# Patient Record
Sex: Female | Born: 1950 | Race: Black or African American | Hispanic: No | Marital: Single
Health system: Southern US, Community
[De-identification: ages and names within clinical notes are randomized; demographics above are authoritative.]

## PROBLEM LIST (undated history)

## (undated) DIAGNOSIS — I10 Essential (primary) hypertension: Secondary | ICD-10-CM

## (undated) DIAGNOSIS — E119 Type 2 diabetes mellitus without complications: Secondary | ICD-10-CM

## (undated) DIAGNOSIS — G3184 Mild cognitive impairment, so stated: Secondary | ICD-10-CM

## (undated) DIAGNOSIS — J449 Chronic obstructive pulmonary disease, unspecified: Secondary | ICD-10-CM

---

## 2017-08-12 ENCOUNTER — Other Ambulatory Visit (HOSPITAL_COMMUNITY): Payer: Self-pay | Admitting: Family Medicine

## 2017-08-12 DIAGNOSIS — H538 Other visual disturbances: Secondary | ICD-10-CM

## 2017-08-12 DIAGNOSIS — R51 Headache: Secondary | ICD-10-CM

## 2017-08-12 DIAGNOSIS — H53149 Visual discomfort, unspecified: Secondary | ICD-10-CM

## 2017-08-12 DIAGNOSIS — R519 Headache, unspecified: Secondary | ICD-10-CM

## 2017-08-19 ENCOUNTER — Ambulatory Visit (HOSPITAL_COMMUNITY)
Admission: RE | Admit: 2017-08-19 | Discharge: 2017-08-19 | Disposition: A | Discharge status: Home / Self Care | Payer: Medicare Other | Source: Ambulatory Visit | Attending: Family Medicine | Admitting: Family Medicine

## 2017-08-19 DIAGNOSIS — H53149 Visual discomfort, unspecified: Secondary | ICD-10-CM

## 2017-08-19 DIAGNOSIS — I672 Cerebral atherosclerosis: Secondary | ICD-10-CM | POA: Diagnosis not present

## 2017-08-19 DIAGNOSIS — R51 Headache: Secondary | ICD-10-CM | POA: Insufficient documentation

## 2017-08-19 DIAGNOSIS — I6782 Cerebral ischemia: Secondary | ICD-10-CM | POA: Diagnosis not present

## 2017-08-19 DIAGNOSIS — R519 Headache, unspecified: Secondary | ICD-10-CM

## 2017-08-19 DIAGNOSIS — H538 Other visual disturbances: Secondary | ICD-10-CM

## 2017-08-19 MED ORDER — IOPAMIDOL (ISOVUE-300) INJECTION 61%
INTRAVENOUS | Status: AC
  Filled 2017-08-19: qty 75

## 2017-08-19 MED ORDER — IOPAMIDOL (ISOVUE-300) INJECTION 61%
75.0000 mL | Freq: Once | INTRAVENOUS | Status: AC | PRN
  Administered 2017-08-19: 75 mL via INTRAVENOUS

## 2019-05-01 IMAGING — CT CT HEAD WO/W CM
3 of 4 series · 15 of 47 positions shown, 18 images · IV contrast (iopamidol)
Comparison: None.

CLINICAL DATA: Persistent headache, photophobia and blurry vision
for 3 months.

EXAM:
CT HEAD WITHOUT AND WITH CONTRAST
TECHNIQUE: Contiguous axial images were obtained from the base of the skull
through the vertex without and with intravenous contrast
CONTRAST:  75mL 8TYJS4-FII IOPAMIDOL (8TYJS4-FII) INJECTION 61%

[Series 2: head wo · axial · 0.47mm/px · z∈[+1289,+1409]mm · 9 of 30 slices shown, 12 images]
[im 3/30  brain]
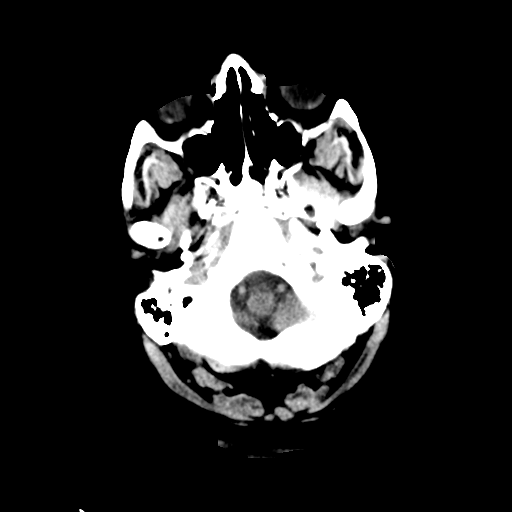
[im 3/30  bone]
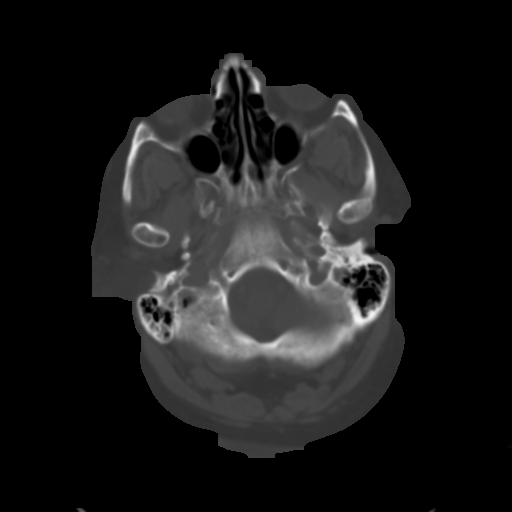
[im 7/30  brain]
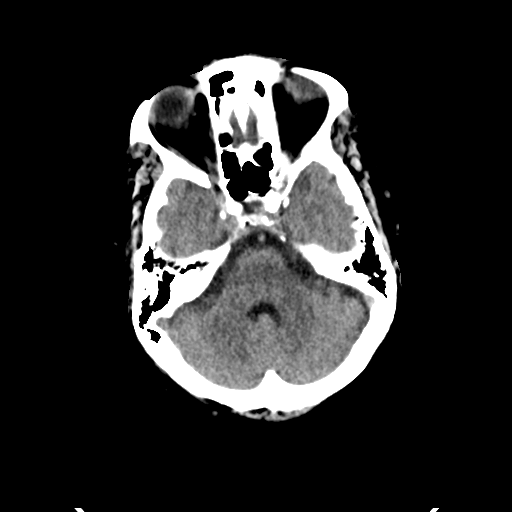
[im 9/30  brain]
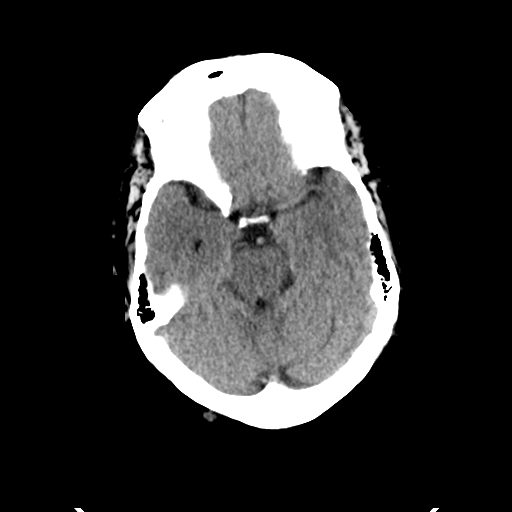
[im 13/30  brain]
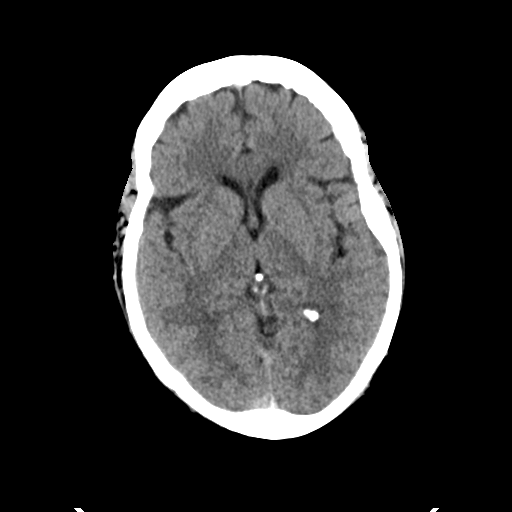
[im 15/30  brain]
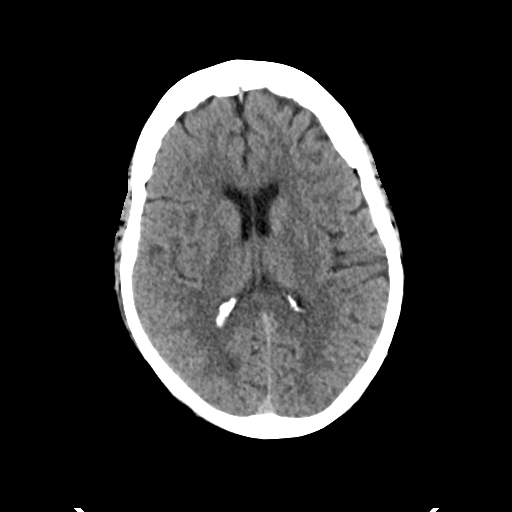
[im 15/30  bone]
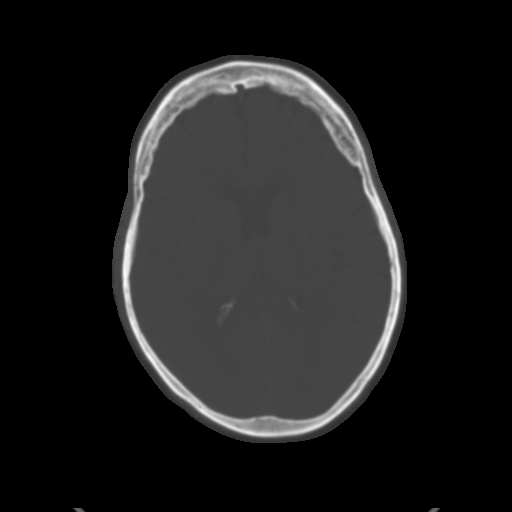
[im 17/30  brain]
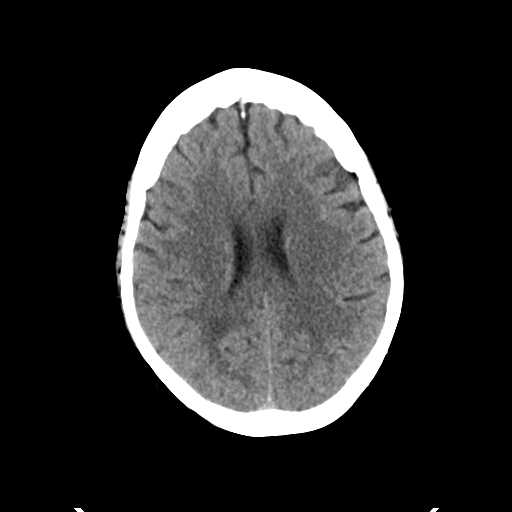
[im 21/30  brain]
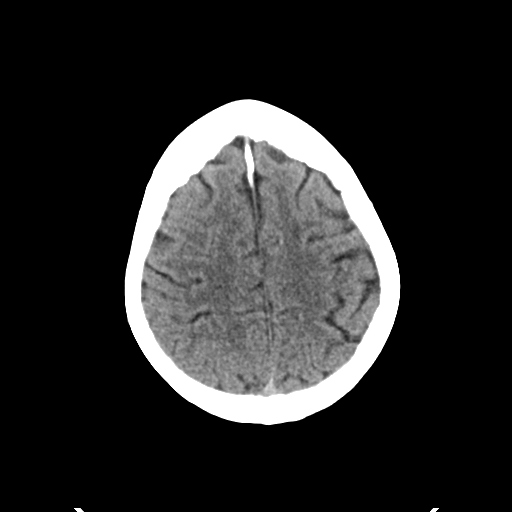
[im 23/30  brain]
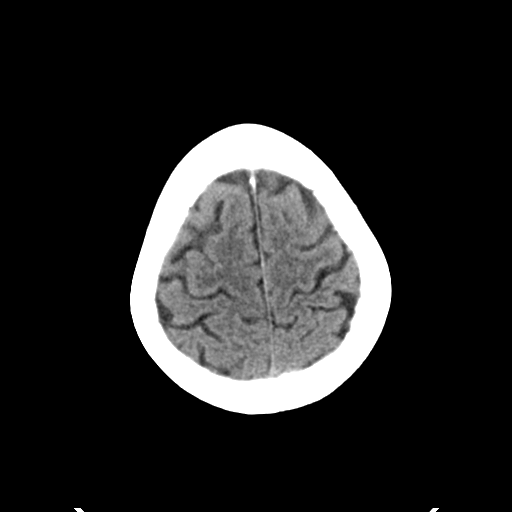
[im 27/30  brain]
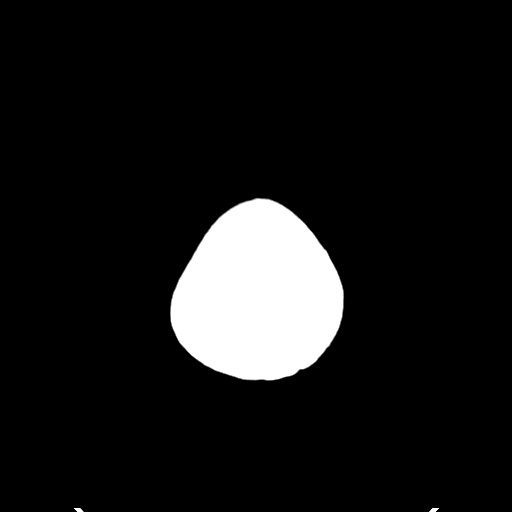
[im 27/30  bone]
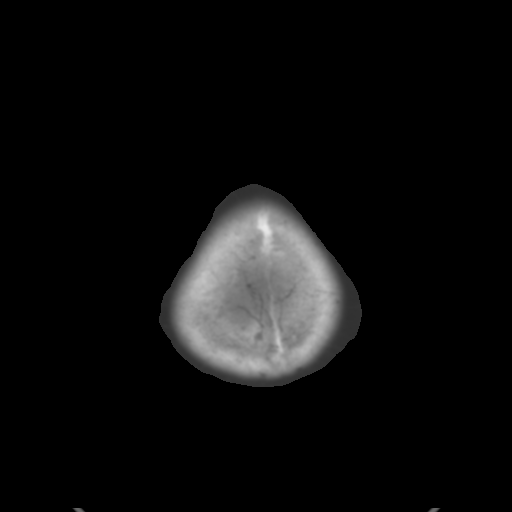

[Series 5: coronal soft tissue · coronal · 0.31mm/px · 3 of 64 slices shown]
[im 22/64  brain]
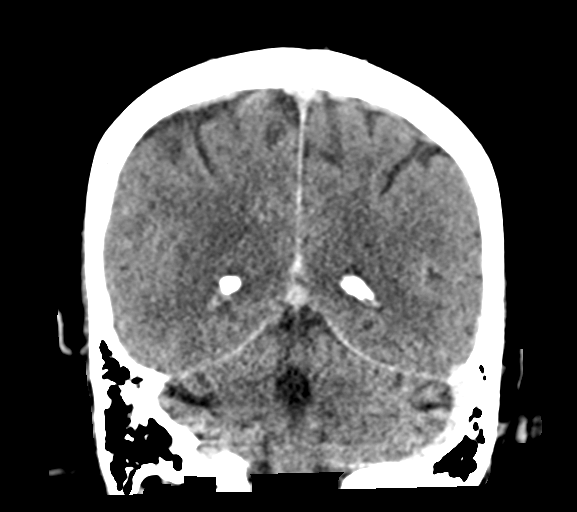
[im 29/64  brain]
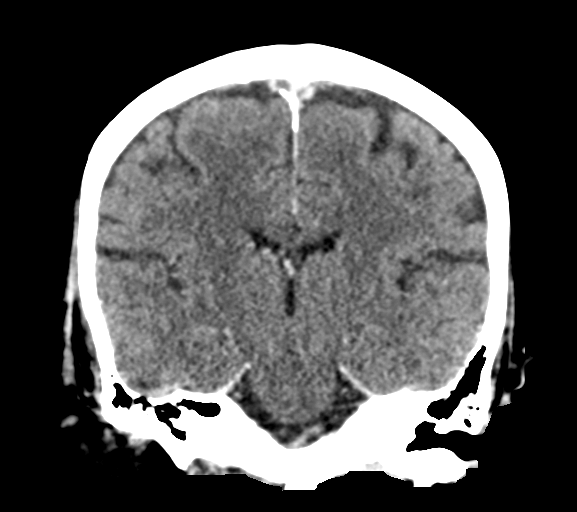
[im 36/64  brain]
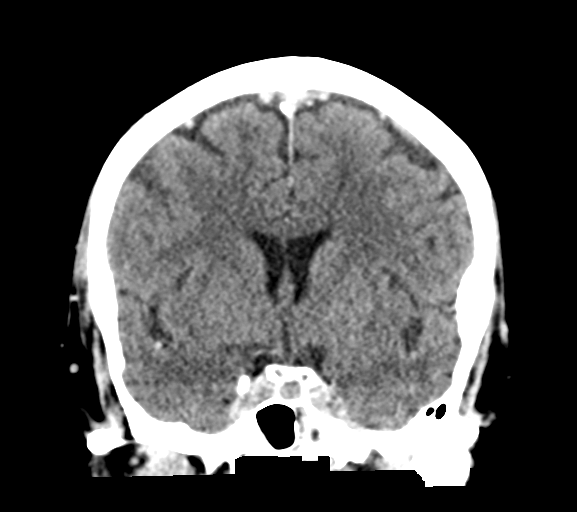

[Series 6: sagittal soft tissue · sagittal · 0.31mm/px · 3 of 60 slices shown]
[im 20/60  brain]
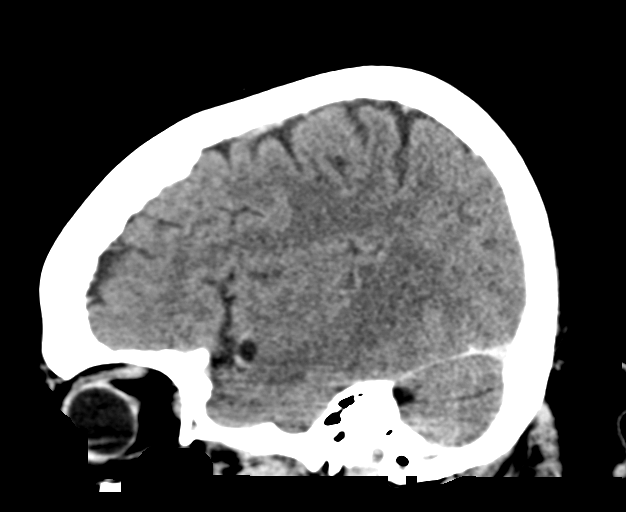
[im 30/60  brain]
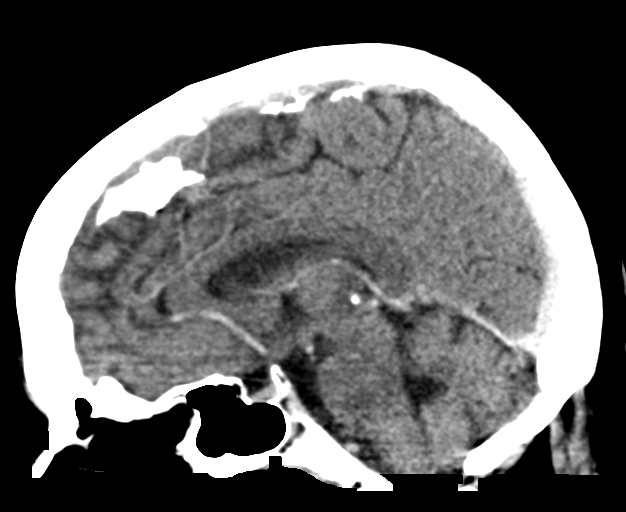
[im 40/60  brain]
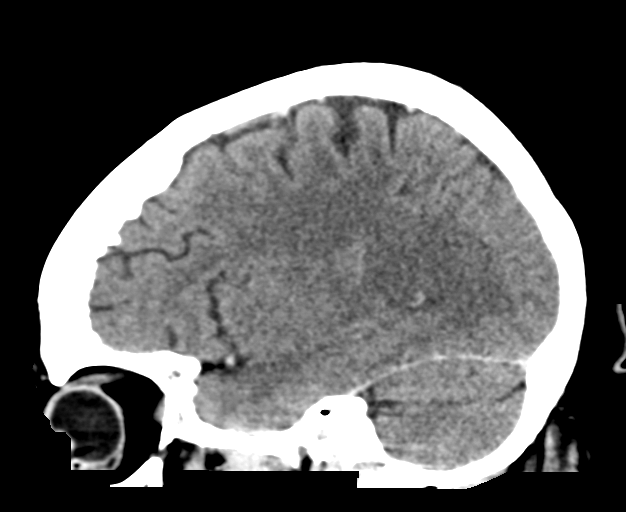

[15 of 47 positions shown; findings below may reference images not displayed]

FINDINGS: BRAIN: No intraparenchymal hemorrhage, mass effect nor midline
shift. The ventricles and sulci are normal for age. Patchy
supratentorial white matter hypodensities. No acute large vascular
territory infarcts. No abnormal extra-axial fluid collections. Basal
cisterns are patent. No abnormal intracranial enhancement, dural
venous sinuses are patent. 4 mm pineal cyst.

VASCULAR: Moderate calcific atherosclerosis of the carotid siphons.

SKULL: No skull fracture. No significant scalp soft tissue swelling.

SINUSES/ORBITS: The mastoid air-cells and included paranasal sinuses
are well-aerated.The included ocular globes and orbital contents are
non-suspicious.

OTHER: None.
IMPRESSION: 1. No acute intracranial process.
2. Mild chronic small vessel ischemic disease and moderate
atherosclerosis.

## 2022-09-17 ENCOUNTER — Encounter (HOSPITAL_COMMUNITY): Payer: Self-pay

## 2022-09-17 ENCOUNTER — Emergency Department (HOSPITAL_COMMUNITY): Payer: Medicare Other

## 2022-09-17 ENCOUNTER — Other Ambulatory Visit: Payer: Self-pay

## 2022-09-17 ENCOUNTER — Inpatient Hospital Stay (HOSPITAL_COMMUNITY)
Admission: EM | Admit: 2022-09-17 | Discharge: 2022-09-20 | DRG: 871 | Disposition: A | Discharge status: Skilled Nursing Facility | Payer: Medicare Other | Source: Skilled Nursing Facility | Attending: Internal Medicine | Admitting: Internal Medicine

## 2022-09-17 DIAGNOSIS — E785 Hyperlipidemia, unspecified: Secondary | ICD-10-CM | POA: Diagnosis present

## 2022-09-17 DIAGNOSIS — J441 Chronic obstructive pulmonary disease with (acute) exacerbation: Secondary | ICD-10-CM | POA: Diagnosis present

## 2022-09-17 DIAGNOSIS — J9601 Acute respiratory failure with hypoxia: Secondary | ICD-10-CM | POA: Diagnosis present

## 2022-09-17 DIAGNOSIS — M6240 Contracture of muscle, unspecified site: Secondary | ICD-10-CM | POA: Diagnosis present

## 2022-09-17 DIAGNOSIS — F329 Major depressive disorder, single episode, unspecified: Secondary | ICD-10-CM | POA: Diagnosis present

## 2022-09-17 DIAGNOSIS — H6121 Impacted cerumen, right ear: Secondary | ICD-10-CM | POA: Diagnosis present

## 2022-09-17 DIAGNOSIS — M069 Rheumatoid arthritis, unspecified: Secondary | ICD-10-CM | POA: Diagnosis present

## 2022-09-17 DIAGNOSIS — R4182 Altered mental status, unspecified: Secondary | ICD-10-CM | POA: Diagnosis not present

## 2022-09-17 DIAGNOSIS — I10 Essential (primary) hypertension: Secondary | ICD-10-CM | POA: Diagnosis present

## 2022-09-17 DIAGNOSIS — E041 Nontoxic single thyroid nodule: Secondary | ICD-10-CM | POA: Diagnosis present

## 2022-09-17 DIAGNOSIS — G9341 Metabolic encephalopathy: Secondary | ICD-10-CM | POA: Diagnosis present

## 2022-09-17 DIAGNOSIS — R6520 Severe sepsis without septic shock: Secondary | ICD-10-CM | POA: Diagnosis present

## 2022-09-17 DIAGNOSIS — N3000 Acute cystitis without hematuria: Secondary | ICD-10-CM | POA: Diagnosis not present

## 2022-09-17 DIAGNOSIS — N390 Urinary tract infection, site not specified: Secondary | ICD-10-CM | POA: Diagnosis present

## 2022-09-17 DIAGNOSIS — E860 Dehydration: Secondary | ICD-10-CM | POA: Diagnosis present

## 2022-09-17 DIAGNOSIS — I959 Hypotension, unspecified: Secondary | ICD-10-CM | POA: Diagnosis present

## 2022-09-17 DIAGNOSIS — J449 Chronic obstructive pulmonary disease, unspecified: Secondary | ICD-10-CM | POA: Diagnosis present

## 2022-09-17 DIAGNOSIS — H9201 Otalgia, right ear: Secondary | ICD-10-CM | POA: Diagnosis present

## 2022-09-17 DIAGNOSIS — K219 Gastro-esophageal reflux disease without esophagitis: Secondary | ICD-10-CM | POA: Diagnosis present

## 2022-09-17 DIAGNOSIS — M810 Age-related osteoporosis without current pathological fracture: Secondary | ICD-10-CM | POA: Diagnosis present

## 2022-09-17 DIAGNOSIS — G3184 Mild cognitive impairment, so stated: Secondary | ICD-10-CM | POA: Diagnosis present

## 2022-09-17 DIAGNOSIS — A419 Sepsis, unspecified organism: Secondary | ICD-10-CM | POA: Diagnosis present

## 2022-09-17 DIAGNOSIS — D509 Iron deficiency anemia, unspecified: Secondary | ICD-10-CM | POA: Diagnosis present

## 2022-09-17 DIAGNOSIS — E119 Type 2 diabetes mellitus without complications: Secondary | ICD-10-CM | POA: Diagnosis present

## 2022-09-17 DIAGNOSIS — F411 Generalized anxiety disorder: Secondary | ICD-10-CM | POA: Diagnosis present

## 2022-09-17 DIAGNOSIS — Z79899 Other long term (current) drug therapy: Secondary | ICD-10-CM | POA: Diagnosis not present

## 2022-09-17 DIAGNOSIS — Z1152 Encounter for screening for COVID-19: Secondary | ICD-10-CM

## 2022-09-17 DIAGNOSIS — K5900 Constipation, unspecified: Secondary | ICD-10-CM | POA: Diagnosis present

## 2022-09-17 DIAGNOSIS — B9620 Unspecified Escherichia coli [E. coli] as the cause of diseases classified elsewhere: Secondary | ICD-10-CM | POA: Diagnosis present

## 2022-09-17 LAB — CBC WITH DIFFERENTIAL/PLATELET
Abs Immature Granulocytes: 0.04 10*3/uL (ref 0.00–0.07)
Basophils Absolute: 0 10*3/uL (ref 0.0–0.1)
Basophils Relative: 0 %
Eosinophils Absolute: 0 10*3/uL (ref 0.0–0.5)
Eosinophils Relative: 0 %
HCT: 37.5 % (ref 36.0–46.0)
Hemoglobin: 11.7 g/dL — ABNORMAL LOW (ref 12.0–15.0)
Immature Granulocytes: 1 %
Lymphocytes Relative: 15 %
Lymphs Abs: 0.8 10*3/uL (ref 0.7–4.0)
MCH: 28.3 pg (ref 26.0–34.0)
MCHC: 31.2 g/dL (ref 30.0–36.0)
MCV: 90.8 fL (ref 80.0–100.0)
Monocytes Absolute: 0.3 10*3/uL (ref 0.1–1.0)
Monocytes Relative: 6 %
Neutro Abs: 3.9 10*3/uL (ref 1.7–7.7)
Neutrophils Relative %: 78 %
Platelets: 264 10*3/uL (ref 150–400)
RBC: 4.13 MIL/uL (ref 3.87–5.11)
RDW: 14.5 % (ref 11.5–15.5)
WBC: 5 10*3/uL (ref 4.0–10.5)
nRBC: 0 % (ref 0.0–0.2)

## 2022-09-17 LAB — COMPREHENSIVE METABOLIC PANEL
ALT: 15 U/L (ref 0–44)
AST: 18 U/L (ref 15–41)
Albumin: 3 g/dL — ABNORMAL LOW (ref 3.5–5.0)
Alkaline Phosphatase: 111 U/L (ref 38–126)
Anion gap: 9 (ref 5–15)
BUN: 15 mg/dL (ref 8–23)
CO2: 26 mmol/L (ref 22–32)
Calcium: 9.3 mg/dL (ref 8.9–10.3)
Chloride: 101 mmol/L (ref 98–111)
Creatinine, Ser: 1.01 mg/dL — ABNORMAL HIGH (ref 0.44–1.00)
GFR, Estimated: 60 mL/min — ABNORMAL LOW (ref >60)
Glucose, Bld: 160 mg/dL — ABNORMAL HIGH (ref 70–99)
Potassium: 3.9 mmol/L (ref 3.5–5.1)
Sodium: 136 mmol/L (ref 135–145)
Total Bilirubin: 0.4 mg/dL (ref 0.3–1.2)
Total Protein: 8.6 g/dL — ABNORMAL HIGH (ref 6.5–8.1)

## 2022-09-17 LAB — LACTIC ACID, PLASMA
Lactic Acid, Venous: 1.2 mmol/L (ref 0.5–1.9)
Lactic Acid, Venous: 1.8 mmol/L (ref 0.5–1.9)

## 2022-09-17 LAB — URINALYSIS, W/ REFLEX TO CULTURE (INFECTION SUSPECTED)
Bilirubin Urine: NEGATIVE
Glucose, UA: NEGATIVE mg/dL
Ketones, ur: NEGATIVE mg/dL
Nitrite: POSITIVE — AB
Protein, ur: 30 mg/dL — AB
Specific Gravity, Urine: 1.014 (ref 1.005–1.030)
WBC, UA: >50 WBC/hpf (ref 0–5)
pH: 5 (ref 5.0–8.0)

## 2022-09-17 LAB — I-STAT CHEM 8, ED
BUN: 16 mg/dL (ref 8–23)
Calcium, Ion: 1.18 mmol/L (ref 1.15–1.40)
Chloride: 104 mmol/L (ref 98–111)
Creatinine, Ser: 1 mg/dL (ref 0.44–1.00)
Glucose, Bld: 167 mg/dL — ABNORMAL HIGH (ref 70–99)
HCT: 38 % (ref 36.0–46.0)
Hemoglobin: 12.9 g/dL (ref 12.0–15.0)
Potassium: 4.1 mmol/L (ref 3.5–5.1)
Sodium: 140 mmol/L (ref 135–145)
TCO2: 26 mmol/L (ref 22–32)

## 2022-09-17 LAB — BRAIN NATRIURETIC PEPTIDE: B Natriuretic Peptide: 38.2 pg/mL (ref 0.0–100.0)

## 2022-09-17 LAB — RESP PANEL BY RT-PCR (RSV, FLU A&B, COVID)  RVPGX2
Influenza A by PCR: NEGATIVE
Influenza B by PCR: NEGATIVE
Resp Syncytial Virus by PCR: NEGATIVE
SARS Coronavirus 2 by RT PCR: NEGATIVE

## 2022-09-17 LAB — APTT: aPTT: 30 seconds (ref 24–36)

## 2022-09-17 LAB — PROTIME-INR
INR: 1.2 (ref 0.8–1.2)
Prothrombin Time: 14.7 seconds (ref 11.4–15.2)

## 2022-09-17 LAB — HIV ANTIBODY (ROUTINE TESTING W REFLEX): HIV Screen 4th Generation wRfx: NONREACTIVE

## 2022-09-17 MED ORDER — LACTATED RINGERS IV BOLUS (SEPSIS)
1000.0000 mL | Freq: Once | INTRAVENOUS | Status: AC
  Administered 2022-09-17: 1000 mL via INTRAVENOUS

## 2022-09-17 MED ORDER — IPRATROPIUM-ALBUTEROL 0.5-2.5 (3) MG/3ML IN SOLN
3.0000 mL | Freq: Once | RESPIRATORY_TRACT | Status: AC
  Administered 2022-09-17: 3 mL via RESPIRATORY_TRACT
  Filled 2022-09-17: qty 3

## 2022-09-17 MED ORDER — SODIUM CHLORIDE 0.9 % IV SOLN
2.0000 g | Freq: Three times a day (TID) | INTRAVENOUS | Status: DC
  Administered 2022-09-18: 2 g via INTRAVENOUS
  Filled 2022-09-17 (×2): qty 12.5

## 2022-09-17 MED ORDER — IPRATROPIUM BROMIDE 0.02 % IN SOLN: 0.5000 mg | Freq: Three times a day (TID) | RESPIRATORY_TRACT | Status: DC

## 2022-09-17 MED ORDER — METRONIDAZOLE 500 MG/100ML IV SOLN
500.0000 mg | Freq: Once | INTRAVENOUS | Status: AC
  Administered 2022-09-17: 500 mg via INTRAVENOUS
  Filled 2022-09-17: qty 100

## 2022-09-17 MED ORDER — ENOXAPARIN SODIUM 80 MG/0.8ML IJ SOSY
80.0000 mg | PREFILLED_SYRINGE | INTRAMUSCULAR | Status: DC
  Administered 2022-09-17 – 2022-09-19 (×3): 80 mg via SUBCUTANEOUS
  Filled 2022-09-17 (×4): qty 0.8

## 2022-09-17 MED ORDER — IPRATROPIUM BROMIDE HFA 17 MCG/ACT IN AERS: 1.0000 | INHALATION_SPRAY | Freq: Three times a day (TID) | RESPIRATORY_TRACT | Status: DC

## 2022-09-17 MED ORDER — IPRATROPIUM BROMIDE 0.02 % IN SOLN
0.5000 mg | Freq: Three times a day (TID) | RESPIRATORY_TRACT | Status: DC
  Administered 2022-09-17 – 2022-09-18 (×2): 0.5 mg via RESPIRATORY_TRACT
  Filled 2022-09-17 (×2): qty 2.5

## 2022-09-17 MED ORDER — VANCOMYCIN HCL 10 G IV SOLR
2500.0000 mg | Freq: Once | INTRAVENOUS | Status: AC
  Administered 2022-09-17: 2500 mg via INTRAVENOUS
  Filled 2022-09-17: qty 2500

## 2022-09-17 MED ORDER — VANCOMYCIN HCL IN DEXTROSE 1-5 GM/200ML-% IV SOLN: 1000.0000 mg | Freq: Once | INTRAVENOUS | Status: DC

## 2022-09-17 MED ORDER — VANCOMYCIN HCL 1500 MG/300ML IV SOLN: 1500.0000 mg | INTRAVENOUS | Status: DC

## 2022-09-17 MED ORDER — HYDROMORPHONE HCL 1 MG/ML IJ SOLN
0.5000 mg | INTRAMUSCULAR | Status: DC | PRN
  Administered 2022-09-18 (×2): 0.5 mg via INTRAVENOUS
  Filled 2022-09-17 (×2): qty 0.5

## 2022-09-17 MED ORDER — ACETAMINOPHEN 650 MG RE SUPP
650.0000 mg | Freq: Once | RECTAL | Status: AC
  Administered 2022-09-17: 650 mg via RECTAL
  Filled 2022-09-17: qty 1

## 2022-09-17 MED ORDER — LIDOCAINE 5 % EX PTCH
1.0000 | MEDICATED_PATCH | CUTANEOUS | Status: DC
  Administered 2022-09-17 – 2022-09-19 (×3): 1 via TRANSDERMAL
  Filled 2022-09-17 (×3): qty 1

## 2022-09-17 MED ORDER — LACTATED RINGERS IV SOLN: INTRAVENOUS | Status: AC

## 2022-09-17 MED ORDER — BUDESONIDE 0.5 MG/2ML IN SUSP
0.5000 mg | Freq: Two times a day (BID) | RESPIRATORY_TRACT | Status: DC
  Administered 2022-09-17 – 2022-09-20 (×6): 0.5 mg via RESPIRATORY_TRACT
  Filled 2022-09-17 (×6): qty 2

## 2022-09-17 MED ORDER — SODIUM CHLORIDE 0.9 % IV SOLN
2.0000 g | Freq: Once | INTRAVENOUS | Status: AC
  Administered 2022-09-17: 2 g via INTRAVENOUS
  Filled 2022-09-17: qty 12.5

## 2022-09-17 NOTE — Sepsis Progress Note (Signed)
eLink is following this Code Sepsis. °

## 2022-09-17 NOTE — ED Notes (Signed)
Pt is more alert now. Answers some questions.

## 2022-09-17 NOTE — Hospital Course (Addendum)
5/3: feeling okay today. More awake. Some pain in her hands. Doesn't give more info about when hand pain started. Ears are better. Hasn't been eating. Says she's not hungry or thirsty. Declines any food or drink, says she's not hungry. Knows first name, not able to answer further orientation questions though.

## 2022-09-17 NOTE — ED Notes (Signed)
ED TO INPATIENT HANDOFF REPORT  ED Nurse Name and Phone #: [phone]  S Name/Age/Gender Caprice Renshaw 72 y.o. female Room/Bed: 037C/037C  Code Status   Code Status: Full Code  Home/SNF/Other Skilled nursing facility Patient oriented to: self, place, and time Is this baseline? No   Triage Complete: Triage complete  Chief Complaint UTI (urinary tract infection) [N39.0]  Triage Note No notes on file   Allergies No Known Allergies  Level of Care/Admitting Diagnosis ED Disposition     ED Disposition  Admit   Condition  --   Comment  Hospital Area: [facility] [[id number]]  Level of Care: Med-Surg [16]  May admit patient to [facility] or [facility] if equivalent level of care is available:: No  Covid Evaluation: Confirmed COVID Negative  Diagnosis: UTI (urinary tract infection) [562130]  Admitting Physician: Earl Lagos [phone]  Attending Physician: Earl Lagos [phone]  Certification:: I certify this patient will need inpatient services for at least 2 midnights  Estimated Length of Stay: 2          B Medical/Surgery History History reviewed. No pertinent past medical history. History reviewed. No pertinent surgical history.   A IV Location/Drains/Wounds Patient Lines/Drains/Airways Status     Active Line/Drains/Airways     Name Placement date Placement time Site Days   Peripheral IV 08/19/17 Anterior;Left Hand 08/19/17  1320  Hand  1855   Peripheral IV 09/17/22 18 G Anterior;Right Forearm 09/17/22  1145  Forearm  less than 1            Intake/Output Last 24 hours  Intake/Output Summary (Last 24 hours) at 09/17/2022 1842 Last data filed at 09/17/2022 1528 Gross per 24 hour  Intake 1091.78 ml  Output --  Net 1091.78 ml    Labs/Imaging Results for orders placed or performed during the hospital encounter of 09/17/22 (from the past 48 hour(s))  Lactic acid, plasma     Status: None   Collection Time: 09/17/22 11:58 AM   Result Value Ref Range   Lactic Acid, Venous 1.2 0.5 - 1.9 mmol/L    Comment: Performed at [facility], [address], [place], [place] [address]  Comprehensive metabolic panel     Status: Abnormal   Collection Time: 09/17/22 11:58 AM  Result Value Ref Range   Sodium 136 135 - 145 mmol/L   Potassium 3.9 3.5 - 5.1 mmol/L   Chloride 101 98 - 111 mmol/L   CO2 26 22 - 32 mmol/L   Glucose, Bld 160 (H) 70 - 99 mg/dL    Comment: Glucose reference range applies only to samples taken after fasting for at least 8 hours.   BUN 15 8 - 23 mg/dL   Creatinine, Ser 4.01 (H) 0.44 - 1.00 mg/dL   Calcium 9.3 8.9 - 02.7 mg/dL   Total Protein 8.6 (H) 6.5 - 8.1 g/dL   Albumin 3.0 (L) 3.5 - 5.0 g/dL   AST 18 15 - 41 U/L   ALT 15 0 - 44 U/L   Alkaline Phosphatase 111 38 - 126 U/L   Total Bilirubin 0.4 0.3 - 1.2 mg/dL   GFR, Estimated 60 (L) >60 mL/min    Comment: (NOTE) Calculated using the CKD-EPI Creatinine Equation (2021)    Anion gap 9 5 - 15    Comment: Performed at [facility], [address], [place], [place] [address]  CBC with Differential     Status: Abnormal   Collection Time: 09/17/22 11:58 AM  Result Value Ref Range   WBC 5.0 4.0 - 10.5 K/uL   RBC 4.13 3.87 - 5.11 MIL/uL   Hemoglobin 11.7 (L) 12.0 - 15.0 g/dL   HCT 16.1 09.6 - 04.5 %   MCV 90.8 80.0 - 100.0 fL   MCH 28.3 26.0 - 34.0 pg   MCHC 31.2 30.0 - 36.0 g/dL   RDW 40.9 81.1 - 91.4 %   Platelets 264 150 - 400 K/uL   nRBC 0.0 0.0 - 0.2 %   Neutrophils Relative % 78 %   Neutro Abs 3.9 1.7 - 7.7 K/uL   Lymphocytes Relative 15 %   Lymphs Abs 0.8 0.7 - 4.0 K/uL   Monocytes Relative 6 %   Monocytes Absolute 0.3 0.1 - 1.0 K/uL   Eosinophils Relative 0 %   Eosinophils Absolute 0.0 0.0 - 0.5 K/uL   Basophils Relative 0 %   Basophils Absolute 0.0 0.0 - 0.1 K/uL   Immature Granulocytes 1 %   Abs Immature Granulocytes 0.04 0.00 - 0.07 K/uL    Comment: Performed at [facility], [address]  [place], [place] [address]  Protime-INR     Status: None   Collection Time: 09/17/22 11:58 AM  Result Value Ref Range   Prothrombin Time 14.7 11.4 - 15.2 seconds   INR 1.2 0.8 - 1.2    Comment: (NOTE) INR goal varies based on device and disease states. Performed at [facility], [address], [place], [place] [address]   APTT     Status: None   Collection Time: 09/17/22 11:58 AM  Result Value Ref Range   aPTT 30 24 - 36 seconds    Comment: Performed at [facility], [address], [place], [place] [address]  Urinalysis, w/ Reflex to Culture (Infection Suspected) -Urine, Catheterized     Status: Abnormal   Collection Time: 09/17/22 11:58 AM  Result Value Ref Range   Specimen Source URINE, CATHETERIZED    Color, Urine YELLOW YELLOW   APPearance HAZY (A) CLEAR   Specific Gravity, Urine 1.014 1.005 - 1.030   pH 5.0 5.0 - 8.0   Glucose, UA NEGATIVE NEGATIVE mg/dL   Hgb urine dipstick MODERATE (A) NEGATIVE   Bilirubin Urine NEGATIVE NEGATIVE   Ketones, ur NEGATIVE NEGATIVE mg/dL   Protein, ur 30 (A) NEGATIVE mg/dL   Nitrite POSITIVE (A) NEGATIVE   Leukocytes,Ua LARGE (A) NEGATIVE   RBC / HPF 0-5 0 - 5 RBC/hpf   WBC, UA >50 0 - 5 WBC/hpf    Comment:        Reflex urine culture not performed if WBC <=10, OR if Squamous epithelial cells >5. If Squamous epithelial cells >5 suggest recollection.    Bacteria, UA MANY (A) NONE SEEN   Squamous Epithelial / HPF 0-5 0 - 5 /HPF   WBC Clumps PRESENT    Mucus PRESENT     Comment: Performed at [facility], [address], [place], [place] [address]  Brain natriuretic peptide     Status: None   Collection Time: 09/17/22 11:59 AM  Result Value Ref Range   B Natriuretic Peptide 38.2 0.0 - 100.0 pg/mL    Comment: Performed at [facility], [address], [place], [place] [address]  I-Stat Chem 8, ED     Status: Abnormal   Collection Time: 09/17/22 12:03 PM  Result Value Ref Range   Sodium 140 135 - 145 mmol/L    Potassium 4.1 3.5 -  5.1 mmol/L   Chloride 104 98 - 111 mmol/L   BUN 16 8 - 23 mg/dL   Creatinine, Ser 1.61 0.44 - 1.00 mg/dL   Glucose, Bld 096 (H) 70 - 99 mg/dL    Comment: Glucose reference range applies only to samples taken after fasting for at least 8 hours.   Calcium, Ion 1.18 1.15 - 1.40 mmol/L   TCO2 26 22 - 32 mmol/L   Hemoglobin 12.9 12.0 - 15.0 g/dL   HCT 04.5 40.9 - 81.1 %  Resp panel by RT-PCR (RSV, Flu A&B, Covid) Anterior Nasal Swab     Status: None   Collection Time: 09/17/22  1:06 PM   Specimen: Anterior Nasal Swab  Result Value Ref Range   SARS Coronavirus 2 by RT PCR NEGATIVE NEGATIVE   Influenza A by PCR NEGATIVE NEGATIVE   Influenza B by PCR NEGATIVE NEGATIVE    Comment: (NOTE) The Xpert Xpress SARS-CoV-2/FLU/RSV plus assay is intended as an aid in the diagnosis of influenza from Nasopharyngeal swab specimens and should not be used as a sole basis for treatment. Nasal washings and aspirates are unacceptable for Xpert Xpress SARS-CoV-2/FLU/RSV testing.  Fact Sheet for Patients: BloggerCourse.com  Fact Sheet for Healthcare Providers: SeriousBroker.it  This test is not yet approved or cleared by the [place] FDA and has been authorized for detection and/or diagnosis of SARS-CoV-2 by FDA under an Emergency Use Authorization (EUA). This EUA will remain in effect (meaning this test can be used) for the duration of the COVID-19 declaration under Section 564(b)(1) of the Act, 21 U.S.C. section 360bbb-3(b)(1), unless the authorization is terminated or revoked.     Resp Syncytial Virus by PCR NEGATIVE NEGATIVE    Comment: (NOTE) Fact Sheet for Patients: BloggerCourse.com  Fact Sheet for Healthcare Providers: SeriousBroker.it  This test is not yet approved or cleared by the [place] FDA and has been authorized for detection and/or diagnosis of  SARS-CoV-2 by FDA under an Emergency Use Authorization (EUA). This EUA will remain in effect (meaning this test can be used) for the duration of the COVID-19 declaration under Section 564(b)(1) of the Act, 21 U.S.C. section 360bbb-3(b)(1), unless the authorization is terminated or revoked.  Performed at [facility], [address], [place], [place] [address]    CT HEAD WO CONTRAST ( )  Result Date: 09/17/2022 CLINICAL DATA:  Acute delirium EXAM: CT HEAD WITHOUT CONTRAST TECHNIQUE: Contiguous axial images were obtained from the base of the skull through the vertex without intravenous contrast. RADIATION DOSE REDUCTION: This exam was performed according to the departmental dose-optimization program which includes automated exposure control, adjustment of the mA and/or kV according to patient size and/or use of iterative reconstruction technique. COMPARISON:  08/19/2017 FINDINGS: Brain: Age related brain atrophy and scattered areas of white matter microvascular ischemic changes throughout both cerebral hemispheres. No acute intracranial hemorrhage, new mass lesion, acute infarction, midline shift, herniation, hydrocephalus, or extra-axial fluid collection. No focal mass effect or edema. Cisterns are patent. Cerebellar atrophy as well. Vascular: No hyperdense vessel or unexpected calcification. Skull: Normal. Negative for fracture or focal lesion. Sinuses/Orbits: No acute finding. Other: Bilateral mastoid effusions noted. IMPRESSION: 1. No acute intracranial abnormality by noncontrast CT. 2. Age related brain atrophy and white matter microvascular ischemic changes. 3. Bilateral mastoid effusions. Electronically Signed   By: Judie Petit.  Shick M.D.   On: 09/17/2022 12:48   DG Chest Port 1 View  Result Date: 09/17/2022 CLINICAL DATA:  Possible sepsis EXAM: PORTABLE CHEST 1 VIEW  COMPARISON:  None Available. FINDINGS: Cardiac size is in the upper limits of normal. There are no signs of alveolar pulmonary  edema or focal pulmonary consolidation. Patient's chin is partially obscuring the apices. There is no pleural effusion or pneumothorax. IMPRESSION: There are no focal infiltrates or signs of pulmonary edema. Electronically Signed   By: Ernie Avena M.D.   On: 09/17/2022 12:24    Pending Labs Unresulted Labs (From admission, onward)     Start     Ordered   09/24/22 0500  Creatinine, serum  (enoxaparin (LOVENOX)    CrCl >/= 30 ml/min)  Weekly,   R     Comments: while on enoxaparin therapy    09/17/22 1530   09/18/22 0500  Basic metabolic panel  Tomorrow morning,   R        09/17/22 1530   09/18/22 0500  CBC  Tomorrow morning,   R        09/17/22 1530   09/17/22 1527  Respiratory (~20 pathogens) panel by PCR  (Respiratory panel by PCR (~20 pathogens, ~24 hr TAT)  w precautions)  Once,   R        09/17/22 1530   09/17/22 1527  HIV Antibody (routine testing w rflx)  (HIV Antibody (Routine testing w reflex) panel)  Once,   R        09/17/22 1530   09/17/22 1158  Lactic acid, plasma  (Septic presentation on arrival (screening labs, nursing and treatment orders for obvious sepsis))  Now then every 2 hours,   R (with STAT occurrences)      09/17/22 1159   09/17/22 1158  Blood Culture (routine x 2)  (Septic presentation on arrival (screening labs, nursing and treatment orders for obvious sepsis))  BLOOD CULTURE X 2,   STAT      09/17/22 1159   09/17/22 1158  Urine Culture  Once,   R        09/17/22 1158            Vitals/Pain Today's Vitals   09/17/22 1601 09/17/22 1615 09/17/22 1700 09/17/22 1830  BP:  131/67 (!) 146/84 (!) 141/74  Pulse:  99 95 83  Resp:  (!) 22 19 (!) 21  Temp: 99.1 F (37.3 C)     TempSrc: Oral     SpO2:  98% 100% 98%  Weight:      Height:        Isolation Precautions Droplet precaution  Medications Medications  lactated ringers infusion ( Intravenous New Bag/Given 09/17/22 1314)  ceFEPIme (MAXIPIME) 2 g in sodium chloride 0.9 % 100 mL IVPB (has no  administration in time range)  enoxaparin (LOVENOX) injection 80 mg (has no administration in time range)  budesonide (PULMICORT) nebulizer solution 0.5 mg (has no administration in time range)  HYDROmorphone (DILAUDID) injection 0.5 mg (has no administration in time range)  ipratropium (ATROVENT) nebulizer solution 0.5 mg (has no administration in time range)  ipratropium-albuterol (DUONEB) 0.5-2.5 (3) MG/3ML nebulizer solution 3 mL (3 mLs Nebulization Given 09/17/22 1352)  lactated ringers bolus 1,000 mL (0 mLs Intravenous Stopped 09/17/22 1427)  ceFEPIme (MAXIPIME) 2 g in sodium chloride 0.9 % 100 mL IVPB (0 g Intravenous Stopped 09/17/22 1427)  metroNIDAZOLE (FLAGYL) IVPB 500 mg (0 mg Intravenous Stopped 09/17/22 1528)  acetaminophen (TYLENOL) suppository 650 mg (650 mg Rectal Given 09/17/22 1317)  vancomycin (VANCOCIN) 2,500 mg in sodium chloride 0.9 % 500 mL IVPB (2,500 mg Intravenous New Bag/Given 09/17/22 1522)  ipratropium-albuterol (  DUONEB) 0.5-2.5 (3) MG/3ML nebulizer solution 3 mL (3 mLs Nebulization Given 09/17/22 1522)    Mobility non-ambulatory     Focused Assessments Neuro Assessment Handoff:  Swallow screen pass? Yes  Cardiac Rhythm: Sinus tachycardia       Neuro Assessment: Within Defined Limits Neuro Checks:      Has TPA been given? No If patient is a Neuro Trauma and patient is going to OR before floor call report to 4N Charge nurse: [phone] or [phone]   R Recommendations: See Admitting Provider Note  Report given to:   Additional Notes:

## 2022-09-17 NOTE — ED Provider Notes (Signed)
[place] EMERGENCY DEPARTMENT AT [facility] Provider Note   CSN: [id number] Arrival date & time: 09/17/22  1139     History  Chief Complaint  Patient presents with   Altered Mental Status    Per EMS report, pt was not acting like her self this morning. At baseline, the pt is A&O x4. She is only moaning and answering the occasional no. Will follow with her eyes, but will not follow commands. Report from Lacinda Axon is she had a COVID vaccine yesterday, just as an [place].    Michelle Gay is a 72 y.o. female here for evaluation of altered mental status.  Was seen at her skilled nursing facility was not feeling well yesterday after receiving her COVID-vaccine.  Awoke this morning altered.  Typically ANO x 4.  She is nonmobile at baseline.  Follows some commands however does not answer questions.  HPI     Home Medications Prior to Admission medications   Not on File      Allergies    Patient has no known allergies.    Review of Systems   Review of Systems  Unable to perform ROS: Mental status change    Physical Exam Updated Vital Signs Pulse (!) 105   Temp (!) 103 F (39.4 C) (Axillary)   Resp (!) 22   Ht 5\' 3"  (1.6 m)   Wt (!) 158.8 kg   SpO2 100%   BMI 62.00 kg/m  Physical Exam Vitals and nursing note reviewed. Exam conducted with a chaperone present.  Constitutional:      General: She is in acute distress.     Appearance: She is well-developed. She is obese. She is ill-appearing.  HENT:     Head: Normocephalic and atraumatic.     Nose: Nose normal.     Mouth/Throat:     Mouth: Mucous membranes are moist.  Eyes:     Pupils: Pupils are equal, round, and reactive to light.  Cardiovascular:     Rate and Rhythm: Tachycardia present.     Pulses:          Radial pulses are 2+ on the right side and 2+ on the left side.       Dorsalis pedis pulses are 1+ on the right side and 1+ on the left side.     Heart sounds: Normal heart sounds.  Pulmonary:      Effort: Pulmonary effort is normal. No respiratory distress.     Breath sounds: Normal breath sounds.  Abdominal:     General: Bowel sounds are normal. There is no distension.     Palpations: Abdomen is soft.     Tenderness: There is no abdominal tenderness. There is no guarding or rebound.  Musculoskeletal:        General: No swelling, tenderness or deformity. Normal range of motion.     Cervical back: Normal range of motion.     Right lower leg: No edema.     Left lower leg: No edema.     Comments: Does not move lower extremities  Skin:    General: Skin is warm and dry.     Capillary Refill: Capillary refill takes less than 2 seconds.     Comments: Mild Sacral erythema   Neurological:     Mental Status: She is alert. She is disoriented.     Cranial Nerves: No cranial nerve deficit.     Motor: Weakness present.  Psychiatric:        Mood and  Affect: Mood normal.     ED Results / Procedures / Treatments   Labs (all labs ordered are listed, but only abnormal results are displayed) Labs Reviewed  COMPREHENSIVE METABOLIC PANEL - Abnormal; Notable for the following components:      Result Value   Glucose, Bld 160 (*)    Creatinine, Ser 1.01 (*)    Total Protein 8.6 (*)    Albumin 3.0 (*)    GFR, Estimated 60 (*)    All other components within normal limits  CBC WITH DIFFERENTIAL/PLATELET - Abnormal; Notable for the following components:   Hemoglobin 11.7 (*)    All other components within normal limits  URINALYSIS, W/ REFLEX TO CULTURE (INFECTION SUSPECTED) - Abnormal; Notable for the following components:   APPearance HAZY (*)    Hgb urine dipstick MODERATE (*)    Protein, ur 30 (*)    Nitrite POSITIVE (*)    Leukocytes,Ua LARGE (*)    Bacteria, UA MANY (*)    All other components within normal limits  I-STAT CHEM 8, ED - Abnormal; Notable for the following components:   Glucose, Bld 167 (*)    All other components within normal limits  RESP PANEL BY RT-PCR (RSV, FLU  A&B, COVID)  RVPGX2  CULTURE, BLOOD (ROUTINE X 2)  CULTURE, BLOOD (ROUTINE X 2)  URINE CULTURE  LACTIC ACID, PLASMA  PROTIME-INR  APTT  BRAIN NATRIURETIC PEPTIDE  LACTIC ACID, PLASMA    EKG EKG Interpretation  Date/Time:  Wednesday Sep 17 2022 11:54:32 EDT Ventricular Rate:  104 PR Interval:  140 QRS Duration: 81 QT Interval:  316 QTC Calculation: 416 R Axis:   48 Text Interpretation: Sinus tachycardia Low voltage, precordial leads Nonspecific T abnormalities, lateral leads No old tracing to compare Confirmed by Rolan Bucco [id number]) on 09/17/2022 12:14:26 PM  Radiology CT HEAD WO CONTRAST ( )  Result Date: 09/17/2022 CLINICAL DATA:  Acute delirium EXAM: CT HEAD WITHOUT CONTRAST TECHNIQUE: Contiguous axial images were obtained from the base of the skull through the vertex without intravenous contrast. RADIATION DOSE REDUCTION: This exam was performed according to the departmental dose-optimization program which includes automated exposure control, adjustment of the mA and/or kV according to patient size and/or use of iterative reconstruction technique. COMPARISON:  08/19/2017 FINDINGS: Brain: Age related brain atrophy and scattered areas of white matter microvascular ischemic changes throughout both cerebral hemispheres. No acute intracranial hemorrhage, new mass lesion, acute infarction, midline shift, herniation, hydrocephalus, or extra-axial fluid collection. No focal mass effect or edema. Cisterns are patent. Cerebellar atrophy as well. Vascular: No hyperdense vessel or unexpected calcification. Skull: Normal. Negative for fracture or focal lesion. Sinuses/Orbits: No acute finding. Other: Bilateral mastoid effusions noted. IMPRESSION: 1. No acute intracranial abnormality by noncontrast CT. 2. Age related brain atrophy and white matter microvascular ischemic changes. 3. Bilateral mastoid effusions. Electronically Signed   By: Judie Petit.  Shick M.D.   On: 09/17/2022 12:48   DG Chest Port 1  View  Result Date: 09/17/2022 CLINICAL DATA:  Possible sepsis EXAM: PORTABLE CHEST 1 VIEW COMPARISON:  None Available. FINDINGS: Cardiac size is in the upper limits of normal. There are no signs of alveolar pulmonary edema or focal pulmonary consolidation. Patient's chin is partially obscuring the apices. There is no pleural effusion or pneumothorax. IMPRESSION: There are no focal infiltrates or signs of pulmonary edema. Electronically Signed   By: Ernie Avena M.D.   On: 09/17/2022 12:24    Procedures Ultrasound ED Peripheral IV (Provider)  Date/Time: 09/17/2022 2:45 PM  Performed by: Linwood Dibbles, PA-C Authorized by: Linwood Dibbles, PA-C   Procedure details:    Indications: hydration, hypotension and poor IV access     Skin Prep: povidone-iodine     Location:  Left hand   Angiocath:  18 G   Bedside Ultrasound Guided: Yes     Images: archived     Patient tolerated procedure without complications: Yes     Dressing applied: Yes   .Critical Care  Performed by: Linwood Dibbles, PA-C Authorized by: Linwood Dibbles, PA-C   Critical care provider statement:    Critical care time (minutes):  76   Critical care was necessary to treat or prevent imminent or life-threatening deterioration of the following conditions:  Respiratory failure, sepsis and CNS failure or compromise   Critical care was time spent personally by me on the following activities:  Development of treatment plan with patient or surrogate, discussions with consultants, evaluation of patient's response to treatment, examination of patient, ordering and review of laboratory studies, ordering and review of radiographic studies, ordering and performing treatments and interventions, pulse oximetry, re-evaluation of patient's condition and review of old charts     Medications Ordered in ED Medications  lactated ringers infusion ( Intravenous New Bag/Given 09/17/22 1314)  vancomycin (VANCOCIN) 2,500 mg in sodium  chloride 0.9 % 500 mL IVPB (has no administration in time range)  ceFEPIme (MAXIPIME) 2 g in sodium chloride 0.9 % 100 mL IVPB (has no administration in time range)  vancomycin (VANCOREADY) IVPB 1500 mg/300 mL (has no administration in time range)  ipratropium-albuterol (DUONEB) 0.5-2.5 (3) MG/3ML nebulizer solution 3 mL (has no administration in time range)  ipratropium-albuterol (DUONEB) 0.5-2.5 (3) MG/3ML nebulizer solution 3 mL (3 mLs Nebulization Given 09/17/22 1352)  lactated ringers bolus 1,000 mL (0 mLs Intravenous Stopped 09/17/22 1427)  ceFEPIme (MAXIPIME) 2 g in sodium chloride 0.9 % 100 mL IVPB (0 g Intravenous Stopped 09/17/22 1427)  metroNIDAZOLE (FLAGYL) IVPB 500 mg (500 mg Intravenous New Bag/Given 09/17/22 1354)  acetaminophen (TYLENOL) suppository 650 mg (650 mg Rectal Given 09/17/22 1317)    ED Course/ Medical Decision Making/ A&P    72 year old here for evaluation of altered mental status.  On arrival she is febrile, tachycardic, tachypneic, hypoxic.  Will track with eyes however does not move extremities.  Appears her lower extremities to be flaccid at baseline based on chronic appearance.  Patient logrolled, has unstageable sacral wound however does not appear actively infected.  No obvious cellulitic changes to suggest infectious process.  Her abdomen is soft, mildly distended.  Code sepsis called.  Will plan on labs and imaging.  Tylenol suppository given for fever  Labs and imaging personally viewed and interpreted:  CBC without leukocytosis, hemoglobin 7, no prior to compare Metabolic panel without significant normality Lactic acid 1.2 UA positive for infection BNP 38.2 CT head no acute abnormality DG chest without pneumonia, infiltrates, cardiomegaly, edema   Patient reassessed.  She is more awake.  Clinically still has significant wheezing.  Orders written for additional breathing treatment.  Urinalysis obtained from catheterization from nursing staff very malodorous,  cloudy.  Patient with need to be admitted for sepsis, likely metabolic encephalopathy from UTI as well as COPD exacerbation causing hypoxia.  CONSULT with IM teaching was agreeable to evaluate patient for admission  The patient appears reasonably stabilized for admission considering the current resources, flow, and capabilities available in the ED at this time, and I doubt any other [facility] requiring further screening and/or  treatment in the ED prior to admission.                             Medical Decision Making Amount and/or Complexity of Data Reviewed Independent Historian: EMS External Data Reviewed: labs, radiology, ECG and notes. Labs: ordered. Decision-making details documented in ED Course. Radiology: ordered and independent interpretation performed. Decision-making details documented in ED Course. ECG/medicine tests: ordered and independent interpretation performed. Decision-making details documented in ED Course.  Risk OTC drugs. Prescription drug management. Parenteral controlled substances. Decision regarding hospitalization. Diagnosis or treatment significantly limited by social determinants of health.          Final Clinical Impression(s) / ED Diagnoses Final diagnoses:  Altered mental status, unspecified altered mental status type  Sepsis with acute hypoxic respiratory failure without septic shock, due to unspecified organism [facility])  Acute cystitis without hematuria  COPD exacerbation (HCC)    Rx / DC Orders ED Discharge Orders     None         Audrey Thull A, PA-C 09/17/22 1521    Rolan Bucco, MD 09/18/22 [phone]

## 2022-09-17 NOTE — Progress Notes (Signed)
Pharmacy Antibiotic Note  Michelle Gay is a 72 y.o. female admitted on 09/17/2022 with sepsis - unknown source.  Pharmacy has been consulted for vancomycin/cefepime dosing. SCr 1 on admit.  Plan: Cefepime 2g IV x 1; then 2g IV q8h Vancomycin 2500mg  IV x 1; then 1500mg  IV q24h. Goal AUC 400-550. Expected AUC: 468 SCr used: 1 Flagyl 500mg  IV x 1 per EDP - f/u if to continue Monitor clinical progress, c/s, renal function F/u de-escalation plan/LOT, vancomycin levels as indicated   Height: 5\' 3"  (160 cm) Weight: (!) 158.8 kg (350 lb) IBW/kg (Calculated) : 52.4  Temp (24hrs), Avg:103 F (39.4 C), Min:103 F (39.4 C), Max:103 F (39.4 C)  Recent Labs  Lab 09/17/22 1203  CREATININE 1.00    Estimated Creatinine Clearance: 78.5 mL/min (by C-G formula based on SCr of 1 mg/dL).    No Known Allergies  Antimicrobials this admission: 5/1 vancomycin >>  5/1 cefepime >>  5/1 flagyl x 1  Dose adjustments this admission:   Microbiology results:   [name], PharmD, BCPS Please check AMION for all [facility] contact numbers Clinical Pharmacist 09/17/2022 12:14 PM

## 2022-09-17 NOTE — H&P (Signed)
Date: 09/17/2022                         Patient Name:  Michelle Gay MRN: [id number]  DOB: 1950-07-15 Age / Sex: 72 y.o., female   PCP: Justin Mend, MD               Medical Service: Internal Medicine Teaching Service               Attending Physician: Dr. Earl Lagos, MD      First Contact: Dr. Lajuana Ripple, MD Pager: [phone]  Second Contact: Dr. Champ Mungo, DO Pager: [phone]           After Hours (After 5p/  First Contact Pager: [phone]  weekends / holidays): Second Contact Pager: [phone]   Chief Complaint: Altered mental status  History of Present Illness:  Michelle Gay is a 72 yo female with a PMH of COPD, HTN, T2DM, and mild cognitive impairment who presented to the ED by EMS due to altered mental status. As per Michelle Gay, patient received COVID-19 vaccine yesterday and awoke this morning moaning and answering the occasional no. At baseline, patient is alert and oriented x4.   Today, patient knows her first name but can't answer any other orientation questions. Patient endorses a cough, abdominal pain, and burning with peeing. Limited response will reply "a little" to everything.  Per Michelle Gay, patient began desaturating this morning to 85% and was started on 3L of oxygen. BP at the time was high at 140/90. Patient would not accept medications or water. Gay reports patient has not been urinating more than normal. Cough at baseline, however she is not usually wheezing. No issues with swallowing. Has not been treated for UTI and is full code.  ED course:  Patient presented today from [place] by EMS for AMS. Per EMS found to be hypoxic to 86% now requiring 3L oxygen in the ED. On presentation  febrile at 103F, tachycardic at 105 bpm and HD stable with MAP 97, BP at 125/83. Lactate wnl at 1.2, wbc wnl 5.0, platelets wnl 264, INR wnl 1.2, creatinine 1.01, tbili wnl 0.4. UA with nitrites, pyuria with wbc>50, wbc clumps and mucous and no squamous  cells. No acute intracranial process on CT. No acute cardiopulmonary process on cxr. Respiratory panel was negative for RSV, COVID, and Flu. Patient received duonebs, Flagyl, Vancomycin, and Cefepime. Started on LR bolus of 1,000 mL and continuous 150 mL/hr infusion. Received tylenol suppository, temperature is now 99.49F.   Meds:    ASA 81 Vitamin D 2000u Vitamin B12 500mg  daily Duloxetine 60mg  daily Famotidine 20mg  daily Ferrous sulfate 325mg  daily Fluticasone nasal spray Folic acid 1mg  daily Mirilax 17g daily Lidocaine 4% patch bilateral knees Lipitor 10mg  daily Methotrexate 2.5mg  two tablets very Friday Oxycodone 2.5mg  once daily Senna 2 tablets daily Montelukast 10 mg dialy Valsartan-HCTZ 160-12.5mg  daily Cyclosporin Emulsion 0.05% 2 drops in each eye BID Gabapentin 800mg  BID Budesonide (pulmicort) 0.5mg  q12 hours Topiramate 25mg  BID Tizanadine 4mg  1/2 tablet BID Ipratropium 1inhalation 3 times daily Clonidine 0.1mg  prn for HTN Albuterol 2puff q6hrs prn   PMH GAD Nontoxic thyroid nodule Contracture of muscle, multiple sites Mild cognitive impairment Insomnia Age related osteoporosis MDD Constipation IDA Essential HTN Vitamin D deficiency T2DM COPD RA  Allergies: Allergies as of 09/17/2022   (No Known Allergies)   History reviewed. No pertinent past medical history.  Family History:  Not able to obtain  Social History:  Lives in [facility]   Review of Systems: A complete ROS was negative except as per HPI.   Physical Exam: Blood pressure (!) 146/84, pulse 95, temperature 99.1 F (37.3 C), temperature source Oral, resp. rate 19, height 5\' 3"  (1.6 m), weight (!) 158.8 kg, SpO2 100 %. Constitutional: Female laying flat in bed. In no acute distress. HEENT: Sclera non-icteric, EOM intact, dry mucous membranes  Cardio:Regular rate and rhythm. Warm and well-perfused distal extremities. Pulm:Expiratory wheezing present. Normal work of breathing on  room air. Stating well when transitioned to room air. Abdomen: Soft, non-distended, grimaces and says "don't do that" when abdomen is palpated. Normal active bowel sounds.  MSK: Trace lower extremity edema, feet are fixed in plantar flexion.  Skin:Warm and dry with skin flaking in upper and lower extremities. Scar on left ankle.  Neuro: No focal deficits, disoriented, unable to answer yes or no questions or follow commands.  EKG: personally reviewed my interpretation is: sinus tachycardia, regular rhythm,no acute ST/T wave changes or other ischemic abnormalities   CXR: personally reviewed my interpretation is: patient posteriorly rotated, no effusions, consolidation, pulmonary vascular congestion or other acute cardiopulmonary process.       Latest Ref Rng & Units 09/17/2022   12:03 PM 09/17/2022   11:58 AM  CMP  Glucose 70 - 99 mg/dL 161  096   BUN 8 - 23 mg/dL 16  15   Creatinine 0.45 - 1.00 mg/dL 4.09  8.11   Sodium 914 - 145 mmol/L 140  136   Potassium 3.5 - 5.1 mmol/L 4.1  3.9   Chloride 98 - 111 mmol/L 104  101   CO2 22 - 32 mmol/L  26   Calcium 8.9 - 10.3 mg/dL  9.3   Total Protein 6.5 - 8.1 g/dL  8.6   Total Bilirubin 0.3 - 1.2 mg/dL  0.4   Alkaline Phos 38 - 126 U/L  111   AST 15 - 41 U/L  18   ALT 0 - 44 U/L  15    CBC    Component Value Date/Time   WBC 5.0 09/17/2022 1158   RBC 4.13 09/17/2022 1158   HGB 12.9 09/17/2022 1203   HCT 38.0 09/17/2022 1203   PLT 264 09/17/2022 1158   MCV 90.8 09/17/2022 1158   MCH 28.3 09/17/2022 1158   MCHC 31.2 09/17/2022 1158   RDW 14.5 09/17/2022 1158   LYMPHSABS 0.8 09/17/2022 1158   MONOABS 0.3 09/17/2022 1158   EOSABS 0.0 09/17/2022 1158   BASOSABS 0.0 09/17/2022 1158    Lactic Acid, Venous    Component Value Date/Time   LATICACIDVEN 1.2 09/17/2022 1158     Latest Reference Range & Units Most Recent  URINALYSIS, W/ REFLEX TO CULTURE (INFECTION SUSPECTED)  Rpt ! 09/17/22 11:58  Appearance CLEAR  HAZY ! 09/17/22 11:58   Bilirubin Urine NEGATIVE  NEGATIVE 09/17/22 11:58  Color, Urine YELLOW  YELLOW 09/17/22 11:58  Glucose, UA NEGATIVE mg/dL NEGATIVE 11/24/27 56:21  Hgb urine dipstick NEGATIVE  MODERATE ! 09/17/22 11:58  Ketones, ur NEGATIVE mg/dL NEGATIVE 3/0/86 57:84  Leukocytes,Ua NEGATIVE  LARGE ! 09/17/22 11:58  Nitrite NEGATIVE  POSITIVE ! 09/17/22 11:58  pH 5.0 - 8.0  5.0 09/17/22 11:58  Protein NEGATIVE mg/dL 30 ! 10/25/60 95:28  Specific Gravity, Urine 1.005 - 1.030  1.014 09/17/22 11:58  Specimen Source  URINE, CATHETERIZED 09/17/22 11:58  !: Data is abnormal Rpt: View report in Results Review for more information  Latest Reference Range & Units Most Recent  Bacteria, UA NONE SEEN  MANY ! 09/17/22 11:58  Mucus  PRESENT 09/17/22 11:58  RBC / HPF 0 - 5 RBC/hpf 0-5 09/17/22 11:58  Squamous Epithelial / HPF 0 - 5 /HPF 0-5 09/17/22 11:58  WBC Clumps  PRESENT 09/17/22 11:58  WBC, UA 0 - 5 WBC/hpf >50 09/17/22 11:58  !: Data is abnormal  CT HEAD WO CONTRAST ( )  Result Date: 09/17/2022 CLINICAL DATA:  Acute delirium EXAM: CT HEAD WITHOUT CONTRAST TECHNIQUE: Contiguous axial images were obtained from the base of the skull through the vertex without intravenous contrast. RADIATION DOSE REDUCTION: This exam was performed according to the departmental dose-optimization program which includes automated exposure control, adjustment of the mA and/or kV according to patient size and/or use of iterative reconstruction technique. COMPARISON:  08/19/2017 FINDINGS: Brain: Age related brain atrophy and scattered areas of white matter microvascular ischemic changes throughout both cerebral hemispheres. No acute intracranial hemorrhage, new mass lesion, acute infarction, midline shift, herniation, hydrocephalus, or extra-axial fluid collection. No focal mass effect or edema. Cisterns are patent. Cerebellar atrophy as well. Vascular: No hyperdense vessel or unexpected calcification. Skull: Normal. Negative for fracture or focal  lesion. Sinuses/Orbits: No acute finding. Other: Bilateral mastoid effusions noted. IMPRESSION: 1. No acute intracranial abnormality by noncontrast CT. 2. Age related brain atrophy and white matter microvascular ischemic changes. 3. Bilateral mastoid effusions. Electronically Signed   By: Judie Petit.  Shick M.D.   On: 09/17/2022 12:48   DG Chest Port 1 View  Result Date: 09/17/2022 CLINICAL DATA:  Possible sepsis EXAM: PORTABLE CHEST 1 VIEW COMPARISON:  None Available. FINDINGS: Cardiac size is in the upper limits of normal. There are no signs of alveolar pulmonary edema or focal pulmonary consolidation. Patient's chin is partially obscuring the apices. There is no pleural effusion or pneumothorax. IMPRESSION: There are no focal infiltrates or signs of pulmonary edema. Electronically Signed   By: Ernie Avena M.D.   On: 09/17/2022 12:24     Assessment & Plan by Problem: Principal Problem:   UTI (urinary tract infection)  Porcha Deblanc is a 72 yo female with a pertinent PMH of COPD, HTN, T2DM, and mild cognitive impairment who presented to the ED by EMS due to altered mental status 2/2 UTI.   #altered mental status  #C/f UTI Patient is presenting with AMS, abdominal pain, fever to 103, acute decreased oxygen saturation, and a UA with nitrites, LE, many bacteria, pyuria with wbc>50, and no squamous cells. Our differential includes acute complicated UTI or simple cystitis with fever 2/2 covid vaccine but will treat as complicated UTI none the less. Patient does not meet criteria for sepsis or bacteremia with SBP of 146 and CMP and CBC wnl. Patient received Flagyl, vancomycin, and cefepime but we narrowed our antibiotic preference to cefepime to cover for gram positive and negative organisms. The cause for AMS and increased oxygen demand is unlikely to be caused by a pulmonary infection or COPD exacerbation given the clear CXR, no new cough or sputum production, and nml WBC. Skin infection is also unlikely  although unstageable pressure sacral wound was noted by ED and unable to be visualized.Wound care was consulted.Receiving continuous LR 150 mL/hr infusion. Received tylenol suppository, temperature is now 99.91F. Holding all oral home meds until speech evaluation. -Cefepime day 1/5.  -LR -tylenol prn  -dilaudid prn  -urine and blood cultures pending -speech evaluation  COPDallergies -oxygen goal 88-92% -Budesonide (pulmicort) 0.5mg  q12 hours -Albuterol 2puff q6hrs  prn -Ipratropium 1inhalation 3 times daily -hold Montelukast 10 mg daily -Fluticasone nasal spray  HTN -hold valsartan-HCTZ 160-12.5mg  daily -hold ASA 81  GERD -hold Famotidine 20mg  daily  IDA - hold Ferrous sulfate 325mg  daily  HLD - hold Lipitor 10mg  daily  Rheumatoid arthritis -Hold Methotrexate 2.5mg  two tablets very Friday -Hold Oxycodone 2.5mg  once daily -Lidocaine 4% patch bilateral knees -hold Senna 2 tablets daily -hold Mirilax 17g daily  GAD Hold Gabapentin 800mg  BID Hold Duloxetine 60mg  daily  Headaches Hold Topiramate 25mg  BID  Muscle contractures Hold Tizanadine 4mg  1/2 tablet BID   Dispo: Admit patient to Inpatient with expected length of stay greater than 2 midnights.  Signed: Annette Stable, Medical Student 09/17/2022, 5:21 PM  Pager: @MYPAGER @ After 5pm on weekdays and 1pm on weekends: On Call pager: [phone]   Attestation for Student Documentation:  I personally was present and performed or re-performed the history, physical exam and medical decision-making activities of this service and have verified that the service and findings are accurately documented in the student's note.  Willette Cluster, MD 09/17/2022, 6:46 PM

## 2022-09-17 NOTE — ED Notes (Signed)
Pt is alert to self only. Responds to painful stimuli. Pt has an unstageable moisture injury to her sacrum area.

## 2022-09-18 DIAGNOSIS — A419 Sepsis, unspecified organism: Secondary | ICD-10-CM

## 2022-09-18 DIAGNOSIS — R4182 Altered mental status, unspecified: Secondary | ICD-10-CM

## 2022-09-18 LAB — BLOOD CULTURE ID PANEL (REFLEXED) - BCID2

## 2022-09-18 LAB — CBC
HCT: 35.8 % — ABNORMAL LOW (ref 36.0–46.0)
Hemoglobin: 10.7 g/dL — ABNORMAL LOW (ref 12.0–15.0)
MCH: 27.9 pg (ref 26.0–34.0)
MCHC: 29.9 g/dL — ABNORMAL LOW (ref 30.0–36.0)
MCV: 93.5 fL (ref 80.0–100.0)
Platelets: 192 10*3/uL (ref 150–400)
RBC: 3.83 MIL/uL — ABNORMAL LOW (ref 3.87–5.11)
RDW: 15 % (ref 11.5–15.5)
WBC: 3.8 10*3/uL — ABNORMAL LOW (ref 4.0–10.5)
nRBC: 0 % (ref 0.0–0.2)

## 2022-09-18 LAB — BLOOD GAS, VENOUS
Acid-Base Excess: 2.2 mmol/L — ABNORMAL HIGH (ref 0.0–2.0)
Bicarbonate: 27.8 mmol/L (ref 20.0–28.0)
Drawn by: 42628
O2 Saturation: 49.8 %
Patient temperature: 37.4
pCO2, Ven: 47 mmHg (ref 44–60)
pH, Ven: 7.38 (ref 7.25–7.43)
pO2, Ven: <31 mmHg — CL (ref 32–45)

## 2022-09-18 LAB — BASIC METABOLIC PANEL
Anion gap: 10 (ref 5–15)
BUN: 15 mg/dL (ref 8–23)
CO2: 25 mmol/L (ref 22–32)
Calcium: 8.8 mg/dL — ABNORMAL LOW (ref 8.9–10.3)
Chloride: 102 mmol/L (ref 98–111)
Creatinine, Ser: 1.13 mg/dL — ABNORMAL HIGH (ref 0.44–1.00)
GFR, Estimated: 52 mL/min — ABNORMAL LOW (ref >60)
Glucose, Bld: 151 mg/dL — ABNORMAL HIGH (ref 70–99)
Potassium: 3.8 mmol/L (ref 3.5–5.1)
Sodium: 137 mmol/L (ref 135–145)

## 2022-09-18 LAB — CULTURE, BLOOD (ROUTINE X 2)

## 2022-09-18 MED ORDER — SODIUM CHLORIDE 0.9 % IV SOLN
2.0000 g | INTRAVENOUS | Status: DC
  Administered 2022-09-18 – 2022-09-20 (×3): 2 g via INTRAVENOUS
  Filled 2022-09-18 (×3): qty 20

## 2022-09-18 MED ORDER — CARBAMIDE PEROXIDE 6.5 % OT SOLN
5.0000 [drp] | Freq: Two times a day (BID) | OTIC | Status: DC
  Administered 2022-09-19 – 2022-09-20 (×3): 5 [drp] via OTIC
  Filled 2022-09-18: qty 15

## 2022-09-18 MED ORDER — IPRATROPIUM BROMIDE 0.02 % IN SOLN
0.5000 mg | Freq: Two times a day (BID) | RESPIRATORY_TRACT | Status: DC
  Administered 2022-09-18 – 2022-09-20 (×4): 0.5 mg via RESPIRATORY_TRACT
  Filled 2022-09-18 (×4): qty 2.5

## 2022-09-18 MED ORDER — LACTATED RINGERS IV BOLUS
500.0000 mL | Freq: Once | INTRAVENOUS | Status: AC
  Administered 2022-09-18: 500 mL via INTRAVENOUS

## 2022-09-18 MED ORDER — ACETAMINOPHEN 325 MG PO TABS: 650.0000 mg | ORAL_TABLET | Freq: Four times a day (QID) | ORAL | Status: DC | PRN

## 2022-09-18 NOTE — Consult Note (Signed)
WOC Nurse Consult Note: Reason for Consult:Consult received for Unstageable pressure injury to sacrum. No skin injury found. Patient at moderate risk for pressure injury, prevention protocols in place.  Dressing procedure/placement/frequency:Twice daily skin care using our house skin cleanser and following with an application of moisture barrier ointment is indicated. Turning and repositioning is in place and time in the supine position is to be minimized. Heels will be floated using Prevalon boots, provided today. I have also asked for a pressure redistribution chair cushion for use when the patient is OOB in the chair both while in house and post discharge, at the facility.  WOC nursing team will not follow, but will remain available to this patient, the nursing and medical teams.  Please re-consult if needed.  Thank you for inviting [place] to participate in this patient's Plan of Care.  Ladona Mow, MSN, RN, CNS, GNP, Leda Min, [employer], [employer] phone:  [phone]

## 2022-09-18 NOTE — Evaluation (Signed)
Clinical/Bedside Swallow Evaluation Patient Details  Name: Michelle Gay MRN: [id number] Date of Birth: 24-Apr-1951  Today's Date: 09/18/2022 Time: SLP Start Time (ACUTE ONLY): 4098 SLP Stop Time (ACUTE ONLY): 0919 SLP Time Calculation (min) (ACUTE ONLY): 15 min  Past Medical History: History reviewed. No pertinent past medical history. Past Surgical History: History reviewed. No pertinent surgical history. HPI:  Pt is a 72 yo female who presented due to altered mental status. Pt reportedly received COVID-19 vaccine on the day prior to admission and woke up moaning and answering the occasional no. At baseline, pt is reportedly alert and oriented x4. CT head negative. PMH: COPD, HTN, T2DM, and mild cognitive impairment.    Assessment / Plan / Recommendation  Clinical Impression  Pt was seen for bedside swallow evaluation. Pt's reliability as a historian is questioned, but she denied any baseline difficulty swallowing. Oral mechanism exam was limited due to pt's difficulty following commands; oral inspection revealed thick secretions and an edentulous status. Trials were limited to ice chips and thin liquids due to pt's refusal of all other consistencies. She tolerated these without signs or symptoms of oropharyngeal dysphagia and passed the 3oz challenge twice. A clear liquid diet will be initiated at this time. SLP will follow to ensure tolerance, to assess solids, and for advancement as clinically indicated. SLP Visit Diagnosis: Dysphagia, unspecified (R13.10)    Aspiration Risk  Mild aspiration risk    Diet Recommendation Thin liquid (clear liquids)   Liquid Administration via: Cup;Straw Medication Administration: Whole meds with liquid (or with puree; as tolerated) Supervision: Staff to assist with self feeding Compensations: Slow rate;Minimize environmental distractions Postural Changes: Seated upright at 90 degrees    Other  Recommendations Oral Care Recommendations: Oral care BID     Recommendations for follow up therapy are one component of a multi-disciplinary discharge planning process, led by the attending physician.  Recommendations may be updated based on patient status, additional functional criteria and insurance authorization.  Follow up Recommendations  (TBD)      Assistance Recommended at Discharge    Functional Status Assessment Patient has had a recent decline in their functional status and demonstrates the ability to make significant improvements in function in a reasonable and predictable amount of time.  Frequency and Duration min 2x/week  2 weeks       Prognosis Prognosis for improved oropharyngeal function: Good Barriers to Reach Goals: Cognitive deficits      Swallow Study   General Date of Onset: 09/18/22 HPI: Pt is a 72 yo female who presented due to altered mental status. Pt reportedly received COVID-19 vaccine on the day prior to admission and woke up moaning and answering the occasional no. At baseline, pt is reportedly alert and oriented x4. CT head negative. PMH: COPD, HTN, T2DM, and mild cognitive impairment. Type of Study: Bedside Swallow Evaluation Previous Swallow Assessment: none Diet Prior to this Study: NPO Temperature Spikes Noted: No Respiratory Status: Room air History of Recent Intubation: No Behavior/Cognition: Alert;Cooperative;Requires cueing Oral Cavity Assessment: Excessive secretions Oral Care Completed by SLP: No Oral Cavity - Dentition: Edentulous Self-Feeding Abilities: Needs assist Patient Positioning: Upright in bed;Postural control adequate for testing Baseline Vocal Quality: Normal Volitional Cough: Cognitively unable to elicit Volitional Swallow: Able to elicit    Oral/Motor/Sensory Function Overall Oral Motor/Sensory Function:  (difficult to assess)   Ice Chips Ice chips: Within functional limits Presentation: Spoon   Thin Liquid Thin Liquid: Within functional limits Presentation: Straw    Nectar  Thick Nectar Thick  Liquid: Not tested   Honey Thick Honey Thick Liquid: Not tested   Puree Puree: Not tested   Solid     Solid: Not tested     Michelle Gay Clock, MS, CCC-SLP Acute Rehabilitation Services Office number [phone]  Michelle Gay 09/18/2022,9:25 AM

## 2022-09-18 NOTE — Progress Notes (Signed)
Critical pO2 <31 called to attending. Oxygen saturations remain 97-100% on room air. VS documented and MD aware. Thomas Hoff, RN

## 2022-09-18 NOTE — Progress Notes (Signed)
Removed undocumented foley and will start voiding trial per MD. Thomas Hoff, RN

## 2022-09-18 NOTE — Progress Notes (Addendum)
Subjective: Patient started crying when team entered room. Grimacing but does not respond to questions. Was able to state full name and follow simple commands like squeeze my hand. Nurse Dois Davenport reported that patient was complaining of ear pain and we started tylenol for pain. Patient had foley removed at noon. Speech consulted and recommend liquid diet. Patient said "I dont want any" when food was brought in. Patient did accept water from straw and asked for bed to be laid back.   Objective:  Vital signs in last 24 hours: Vitals:   09/18/22 0442 09/18/22 0710 09/18/22 0852 09/18/22 1108  BP: 120/66 131/60  131/79  Pulse: 91 74  86  Resp: 17 14  14   Temp: 99.1 F (37.3 C) 98.7 F (37.1 C)  99.1 F (37.3 C)  TempSrc: Oral Oral  Oral  SpO2: 100% 100% 100% 95%  Weight:      Height:       Physical Exam Constitutional:      General: She is not in acute distress. HENT:     Head: Normocephalic and atraumatic.     Right Ear: There is impacted cerumen.     Left Ear: Tenderness present. A middle ear effusion is present.     Mouth/Throat:     Mouth: Mucous membranes are dry.  Eyes:     Extraocular Movements: Extraocular movements intact.     Pupils: Pupils are equal, round, and reactive to light.  Cardiovascular:     Rate and Rhythm: Normal rate and regular rhythm.     Pulses: Normal pulses.     Heart sounds: Normal heart sounds. No murmur heard.    No gallop.  Pulmonary:     Effort: Pulmonary effort is normal.     Breath sounds: Normal breath sounds.  Abdominal:     General: Abdomen is flat. Bowel sounds are normal.     Palpations: Abdomen is soft.  Skin:    General: Skin is warm and dry.  Neurological:     Mental Status: She is disoriented.  Psychiatric:        Mood and Affect: Affect is tearful.        Speech: She is noncommunicative.        Latest Ref Rng & Units 09/18/2022    1:48 AM 09/17/2022   12:03 PM 09/17/2022   11:58 AM  CBC  WBC 4.0 - 10.5 K/uL 3.8   5.0    Hemoglobin 12.0 - 15.0 g/dL 82.9  56.2  13.0   Hematocrit 36.0 - 46.0 % 35.8  38.0  37.5   Platelets 150 - 400 K/uL 192   264        Latest Ref Rng & Units 09/18/2022    1:48 AM 09/17/2022   12:03 PM 09/17/2022   11:58 AM  CMP  Glucose 70 - 99 mg/dL 865  784  696   BUN 8 - 23 mg/dL 15  16  15    Creatinine 0.44 - 1.00 mg/dL 2.95  2.84  1.32   Sodium 135 - 145 mmol/L 137  140  136   Potassium 3.5 - 5.1 mmol/L 3.8  4.1  3.9   Chloride 98 - 111 mmol/L 102  104  101   CO2 22 - 32 mmol/L 25   26   Calcium 8.9 - 10.3 mg/dL 8.8   9.3   Total Protein 6.5 - 8.1 g/dL   8.6   Total Bilirubin 0.3 - 1.2 mg/dL   0.4  Alkaline Phos 38 - 126 U/L   111   AST 15 - 41 U/L   18   ALT 0 - 44 U/L   15      Assessment/Plan:  Principal Problem:   UTI (urinary tract infection)  Michelle Gay is a 72 yo female with a pertinent PMH of COPD, HTN, T2DM, and mild cognitive impairment who presented to the ED by EMS due to altered mental status and decreased oxygen saturation, who was found to have a UTI.   Altered mental status secondary to sepsis likely due to UTI: Patient presented with AMS, fever of 103 F , tachypnea of 22 rr, tachycardia of 105 bpm, and is now leukopenic with WBC at 3800 all of which is consistent with sepsis. Most likely etiology is complicated UTI, since her UA showed pyuria and patient had diffuse abdominal tenderness on exam. Patient was started on Cefepime, but the decision was made to transition to ceftriaxone in order to have a more narrow but still appropriate coverage.  Urine culture is pending.  Blood cultures showed gram-positive cocci in 1 out of 4 blood culture bottles. BCID shows staph but negative for most pathogenic staph. This may be a contaminant, so will repeat urine culture. Patient labs are notable for creatinine elevation likely 2/2 dehydration from decreased oral intake. SLP evaluated and recommended clear liquid diet for now. Patient has been improving hemodynamically  with maps in the 80s to 90s and O2 sats are in the higher 90s on room air.  -Continue with ceftriaxone for now -Follow-up urine culture -follow-up blood cultures -LR bolus 500 mL  -clear liquid diet   Ear pain Patient has a RIGHT ear impaction with cerumen and a effusion on the LEFT ear. Ceftriaxone patient is receiving for UTI would treat an ear infection and carbamide peroxide drops ordered for RIGHT ear impaction.   -carbamide peroxide 5 drops BID on RIGHT ear.   COPDallergies -No evidence of a COPD exacerbation currently.  Will maintain oxygen goal at 88-92%.  Currently patient is not requiring O2 supplementation via nasal cannula. -Budesonide (pulmicort) 0.5mg  q12 hours -Albuterol 2puff q6hrs prn -Ipratropium 1inhalation 3 times daily -hold Montelukast 10 mg daily -Fluticasone nasal spray   HTN -Patient is currently normotensive.  Hold valsartan-HCTZ 160-12.5mg  daily -hold ASA 81   GERD -hold Famotidine 20mg  daily   IDA - hold Ferrous sulfate 325mg  daily   HLD - hold Lipitor 10mg  daily   Rheumatoid arthritis -Hold Methotrexate 2.5mg  two tablets very Friday -Would consider resuming oxycodone 2.5mg  once daily given patient has been in some distress when we examined her -Lidocaine 4% patch bilateral knees -hold Senna 2 tablets daily -hold Mirilax 17g daily   GAD Hold Gabapentin 800mg  BID Hold Duloxetine 60mg  daily   Headaches Hold Topiramate 25mg  BID   Muscle contractures Hold Tizanadine 4mg  1/2 tablet BID  Prior to Admission Living Arrangement: [facility] Axon Anticipated Discharge Location: [facility]  Barriers to Discharge: none  Dispo: Anticipated discharge in approximately 2 day(s).   Annette Stable, Medical Student 09/18/2022, 1:57 PM Pager: [phone] After 5pm on weekdays and 1pm on weekends: On Call pager [phone]

## 2022-09-18 NOTE — Progress Notes (Signed)
Internal Medicine Attending:   I saw and examined the patient. I reviewed the resident's note and I agree with the resident's findings and plan as documented in the resident's note.  In brief, patient is a 72 year old female with a possible history of COPD, hypertension, type 2 diabetes and mild cognitive impairment who presented to the ED with altered mental status x 1 day.  In the ED, patient was noted to be febrile at 58 F with tachycardia 105 bpm and a UA that was notable for pyuria.  Today, patient is more awake and able to tell us her full name and that her eyes hurt but unable to communicate much after that.  On exam, patient is lying in bed in mild distress secondary to pain.  Cardiovascular exam reveals regular rate and rhythm with normal heart sounds.  Lungs are clear to auscultation bilaterally.  Abdomen is soft, nondistended, normoactive bowel sounds with mild tenderness to palpation diffusely.  Lower extremities are nontender to palpation with no edema noted.  Patient is oriented x 1.  HEENT exam reveals bilateral tearing of her eyes but no conjunctival erythema or purulent drainage noted.  Assessment and plan:  1.  Altered mental status secondary to sepsis likely due to UTI: -Patient presented to ED with altered mental status and was found to be febrile up to 103 F with tachypnea up to 22 breaths/min, tachycardia up to 105 bpm and is now leukopenic with a total white count less than 4000 consistent with sepsis.  Etiology for her sepsis is likely underlying urinary tract infection given that her UA showed pyuria and she did have some abdominal tenderness on exam. -Will transition patient to ceftriaxone from cefepime for her likely UTI -Will follow-up urine culture -1 out of 4 blood culture bottles are positive for gram-positive cocci but I suspect this may be contaminant as the BCID shows staph but negative for most pathogenic staph.  I suspect this may be coag negative staph which is  likely contaminant -Will continue to follow-up blood cultures -Continue with IV fluids for now given patient has a mild creatinine elevation likely secondary to prerenal etiology from decreased oral intake -SLP evaluated patient today and recommended clear liquid diet for now -Will continue to monitor the patient closely.  Currently patient's maps are in the 80s to 90s and O2 sats are in the 90s on room air -Chest x-ray with no evidence of underlying infection.  No further workup at this time

## 2022-09-18 NOTE — Progress Notes (Addendum)
PHARMACY - PHYSICIAN COMMUNICATION CRITICAL VALUE ALERT - BLOOD CULTURE IDENTIFICATION (BCID)  Michelle Gay is an 72 y.o. female who presented to [facility] on 09/17/2022 with a chief complaint of altered mental status. Patient endorsed burning with urination, UA with >50 WBC, 0-5 SEC, nitrite pos, and Tmax 103F, and urine culture in process.   Assessment:  1/4 bottles staphylococcus species -  suspect contamination   Name of physician (or Provider) Contacted: Crissie Sickles, MD  Current antibiotics: ceftriaxone 2g IV q24h  Changes to prescribed antibiotics recommended:  Patient is on recommended antibiotics - No changes needed  Results for orders placed or performed during the hospital encounter of 09/17/22  Blood Culture ID Panel (Reflexed) (Collected: 09/17/2022 12:03 PM)  Result Value Ref Range   Enterococcus faecalis NOT DETECTED NOT DETECTED   Enterococcus Faecium NOT DETECTED NOT DETECTED   Listeria monocytogenes NOT DETECTED NOT DETECTED   Staphylococcus species DETECTED (A) NOT DETECTED   Staphylococcus aureus (BCID) NOT DETECTED NOT DETECTED   Staphylococcus epidermidis NOT DETECTED NOT DETECTED   Staphylococcus lugdunensis NOT DETECTED NOT DETECTED   Streptococcus species NOT DETECTED NOT DETECTED   Streptococcus agalactiae NOT DETECTED NOT DETECTED   Streptococcus pneumoniae NOT DETECTED NOT DETECTED   Streptococcus pyogenes NOT DETECTED NOT DETECTED   A.calcoaceticus-baumannii NOT DETECTED NOT DETECTED   Bacteroides fragilis NOT DETECTED NOT DETECTED   Enterobacterales NOT DETECTED NOT DETECTED   Enterobacter cloacae complex NOT DETECTED NOT DETECTED   Escherichia coli NOT DETECTED NOT DETECTED   Klebsiella aerogenes NOT DETECTED NOT DETECTED   Klebsiella oxytoca NOT DETECTED NOT DETECTED   Klebsiella pneumoniae NOT DETECTED NOT DETECTED   Proteus species NOT DETECTED NOT DETECTED   Salmonella species NOT DETECTED NOT DETECTED   Serratia marcescens NOT DETECTED NOT  DETECTED   Haemophilus influenzae NOT DETECTED NOT DETECTED   Neisseria meningitidis NOT DETECTED NOT DETECTED   Pseudomonas aeruginosa NOT DETECTED NOT DETECTED   Stenotrophomonas maltophilia NOT DETECTED NOT DETECTED   Candida albicans NOT DETECTED NOT DETECTED   Candida auris NOT DETECTED NOT DETECTED   Candida glabrata NOT DETECTED NOT DETECTED   Candida krusei NOT DETECTED NOT DETECTED   Candida parapsilosis NOT DETECTED NOT DETECTED   Candida tropicalis NOT DETECTED NOT DETECTED   Cryptococcus neoformans/gattii NOT DETECTED NOT DETECTED    Larena Sox, PharmD PGY1 Pharmacy Resident   09/18/2022  11:22 AM

## 2022-09-19 DIAGNOSIS — N390 Urinary tract infection, site not specified: Secondary | ICD-10-CM | POA: Diagnosis not present

## 2022-09-19 LAB — BASIC METABOLIC PANEL
Anion gap: 13 (ref 5–15)
BUN: 15 mg/dL (ref 8–23)
CO2: 24 mmol/L (ref 22–32)
Calcium: 8.8 mg/dL — ABNORMAL LOW (ref 8.9–10.3)
Chloride: 101 mmol/L (ref 98–111)
Creatinine, Ser: 1.07 mg/dL — ABNORMAL HIGH (ref 0.44–1.00)
GFR, Estimated: 56 mL/min — ABNORMAL LOW (ref >60)
Glucose, Bld: 151 mg/dL — ABNORMAL HIGH (ref 70–99)
Potassium: 3.5 mmol/L (ref 3.5–5.1)
Sodium: 138 mmol/L (ref 135–145)

## 2022-09-19 LAB — CBC
HCT: 31.7 % — ABNORMAL LOW (ref 36.0–46.0)
Hemoglobin: 9.9 g/dL — ABNORMAL LOW (ref 12.0–15.0)
MCH: 28.1 pg (ref 26.0–34.0)
MCHC: 31.2 g/dL (ref 30.0–36.0)
MCV: 90.1 fL (ref 80.0–100.0)
Platelets: 196 10*3/uL (ref 150–400)
RBC: 3.52 MIL/uL — ABNORMAL LOW (ref 3.87–5.11)
RDW: 14.6 % (ref 11.5–15.5)
WBC: 5.9 10*3/uL (ref 4.0–10.5)
nRBC: 0 % (ref 0.0–0.2)

## 2022-09-19 LAB — URINE CULTURE

## 2022-09-19 LAB — CULTURE, BLOOD (ROUTINE X 2)

## 2022-09-19 MED ORDER — DULOXETINE HCL 60 MG PO CPEP
60.0000 mg | ORAL_CAPSULE | Freq: Every day | ORAL | Status: DC
  Administered 2022-09-20: 60 mg via ORAL
  Filled 2022-09-19 (×2): qty 1

## 2022-09-19 MED ORDER — GERHARDT'S BUTT CREAM
TOPICAL_CREAM | Freq: Three times a day (TID) | CUTANEOUS | Status: DC | PRN
  Filled 2022-09-19: qty 1

## 2022-09-19 MED ORDER — LACTATED RINGERS IV BOLUS
1000.0000 mL | Freq: Once | INTRAVENOUS | Status: AC
  Administered 2022-09-19: 1000 mL via INTRAVENOUS

## 2022-09-19 MED ORDER — ENSURE ENLIVE PO LIQD
237.0000 mL | Freq: Three times a day (TID) | ORAL | Status: DC
  Administered 2022-09-19: 237 mL via ORAL

## 2022-09-19 NOTE — Progress Notes (Addendum)
Subjective:  Patient is more oriented today. She is able to say she feels better today. Reports some pain in her hands. Doesn't give more info about when hand pain started. Ears are better. Hasn't been eating. Says she's not hungry or thirsty. Declines any food or drink, says she's not hungry. Asked if she would like some chocolate protein shakes and she nodded. Knows name, not able to answer further orientation questions.   Overnight patient required oxygen for low saturations during sleep and had a fever of 100.7 F in the afternoon.  Spoke to [place] nurse, reports patient can be picky about food but is not known to refuse meals. Her baseline is regular textured diet. SNF nurse also reports no history of OSA or CPAP or oxygen requirements for sleep.  Objective: Blood pressure 112/62, pulse 96, temperature 98.6 F (37 C), temperature source Oral, resp. rate (!) 21, height 5\' 3"  (1.6 m), weight (!) 158.8 kg, SpO2 95 %.  Physical Exam Constitutional:      Appearance: Normal appearance.  HENT:     Head: Normocephalic and atraumatic.     Mouth/Throat:     Mouth: Mucous membranes are dry.  Eyes:     Extraocular Movements: Extraocular movements intact.     Conjunctiva/sclera: Conjunctivae normal.     Pupils: Pupils are equal, round, and reactive to light.  Cardiovascular:     Rate and Rhythm: Regular rhythm.     Heart sounds: Normal heart sounds.  Pulmonary:     Effort: Pulmonary effort is normal.     Breath sounds: Normal breath sounds.  Abdominal:     General: Abdomen is flat. Bowel sounds are normal.     Palpations: Abdomen is soft.  Neurological:     General: No focal deficit present.     Mental Status: She is disoriented.  Psychiatric:        Attention and Perception: She is inattentive.        Mood and Affect: Mood normal.        Speech: Speech is delayed.      Vital signs in last 24 hours: Vitals:   09/18/22 1712 09/18/22 1937 09/19/22 0404 09/19/22 0948  BP:  110/72 (!) 123/90 (!) 122/57 112/62  Pulse: 94  97 96  Resp: 15 19 20  (!) 21  Temp: 99.4 F (37.4 C) (!) 100.7 F (38.2 C)  98.6 F (37 C)  TempSrc: Oral Oral  Oral  SpO2: 99% 92% 95% 95%  Weight:      Height:        Latest Reference Range & Units 09/18/22 16:00  pH, Ven 7.25 - 7.43  7.38  pCO2, Ven 44 - 60 mmHg 47  pO2, Ven 32 - 45 mmHg <31 (LL)  Acid-Base Excess 0.0 - 2.0 mmol/L 2.2 (H)  Bicarbonate 20.0 - 28.0 mmol/L 27.8  O2 Saturation % 49.8  Patient temperature  37.4  Collection site  BLOOD RIGHT HAND  (LL): Data is critically low (H): Data is abnormally high  CMP     Component Value Date/Time   NA 138 09/19/2022 0140   K 3.5 09/19/2022 0140   CL 101 09/19/2022 0140   CO2 24 09/19/2022 0140   GLUCOSE 151 (H) 09/19/2022 0140   BUN 15 09/19/2022 0140   CREATININE 1.07 (H) 09/19/2022 0140   CALCIUM 8.8 (L) 09/19/2022 0140   PROT 8.6 (H) 09/17/2022 1158   ALBUMIN 3.0 (L) 09/17/2022 1158   AST 18 09/17/2022 1158   ALT 15  09/17/2022 1158   ALKPHOS 111 09/17/2022 1158   BILITOT 0.4 09/17/2022 1158   GFRNONAA 56 (L) 09/19/2022 0140       Latest Ref Rng & Units 09/19/2022    1:40 AM 09/18/2022    1:48 AM 09/17/2022   12:03 PM  CBC  WBC 4.0 - 10.5 K/uL 5.9  3.8    Hemoglobin 12.0 - 15.0 g/dL 9.9  16.1  09.6   Hematocrit 36.0 - 46.0 % 31.7  35.8  38.0   Platelets 150 - 400 K/uL 196  192       Assessment/Plan:  Principal Problem:   UTI (urinary tract infection)  Michelle Gay is a 72 yo female with a pertinent PMH of COPD, HTN, T2DM, and mild cognitive impairment who presented to the ED by EMS due to altered mental status and decreased oxygen saturation, who was found to have a UTI.   Altered mental status secondary to sepsis likely due to UTI: Patient presented with AMS, fever of 103 F , tachypnea of 22 rr, and tachycardia of 105 bpm, which is consistent with sepsis 2/2 UTI, since her UA showed pyuria and patient had diffuse abdominal tenderness on exam.    Patient was started on ceftriaxone. Leukocytes have normalized to 5.9 from 3.8. Urine culture is positive for pansensitive E.coli. We will continue the ceftriaxone since patient spiked a fever overnight but will consider transitioning to oral antibiotics tomorrow if patient remains afebrile.  VBG shows pH and pCO2 are within normal limits, ruling out a hypercapnic encephalopathy. pO2 was low which is unexpected given the other normal values of the VBG. Most likely an inaccurate measurement of the oxygen content given patient's oxygen saturations remain 97-100% on room air.   Blood cultures showed gram-positive cocci in 1 out of 4 blood culture bottles growing staph hominis which is a likely contaminant.  Creatinine has decreased to 1.07 from 1.13. Suspect this is prerenal secondary to decreased PO intake. Will provide patient LR bolus for mildly elevated creatinine with dry mucous membranes.   Patient has a normocytic anemia with a hemoglobin of 9.9 today, compared to 12.9 two days ago. Patient has no signs of active bleeding, most likely from effect of fluids. Will continue to monitor closely.   -Continue with ceftriaxone 2g for now. Will transition to oral cephalexin in AM -follow-up blood cultures -CBC  -LR bolus 1000 mL  -transition to liquid diet   Ear pain Patient has a RIGHT ear impaction with cerumen and a effusion on the LEFT ear. Ear pain is improving. Ceftriaxone patient is receiving for UTI would treat an ear infection and carbamide peroxide drops ordered for RIGHT ear impaction.    -carbamide peroxide 5 drops BID on RIGHT ear.    COPDallergies -No evidence of a COPD exacerbation currently.  Will maintain oxygen goal at 88-92%.  Currently patient is not requiring O2 supplementation via nasal cannula. -Budesonide (pulmicort) 0.5mg  q12 hours -Albuterol 2puff q6hrs prn -Ipratropium 1inhalation 3 times daily -hold Montelukast 10 mg daily -Fluticasone nasal spray    HTN -Patient is currently normotensive.  Hold valsartan-HCTZ 160-12.5mg  daily -hold ASA 81   GERD -hold Famotidine 20mg  daily   IDA - hold Ferrous sulfate 325mg  daily   HLD - hold Lipitor 10mg  daily. Will resume in AM if oral intake is improvin   Rheumatoid arthritis -Hold Methotrexate 2.5mg  two tablets very Friday -Would consider resuming oxycodone 2.5mg  once daily given patient has been in some distress when we examined her -Lidocaine 4%  patch bilateral knees -hold Senna 2 tablets daily -hold Mirilax 17g daily   GAD Hold Gabapentin 800mg  BID Hold Duloxetine 60mg  daily   Headaches Hold Topiramate 25mg  BID   Muscle contractures Hold Tizanadine 4mg  1/2 tablet BID  Prior to Admission Living Arrangement: [facility] Axon Anticipated Discharge Location: [facility]       Barriers to Discharge: none Dispo: Anticipated discharge in approximately 1 day(s).   Annette Stable, Medical Student 09/19/2022, 10:07 AM Pager: [phone]  After 5pm on weekdays and 1pm on weekends: On Call pager [phone]

## 2022-09-19 NOTE — Plan of Care (Signed)

## 2022-09-19 NOTE — Progress Notes (Signed)
Speech Language Pathology Treatment: Dysphagia  Patient Details Name: Michelle Gay MRN: [id number] DOB: [birth year] Today's Date: 09/19/2022 Time: 1002-1010 SLP Time Calculation (min) (ACUTE ONLY): 8 min  Assessment / Plan / Recommendation Clinical Impression  Pt was seen for treatment. Pt's RN reported that the pt has been tolerating meals and meds whole with thin liquids without overt s/s of aspiration. Pt was alert during the session, but with similar poor cooperation to that noted yesterday. Pt tolerated consecutive swallows of thin liquids via straw without overt s/s of aspiration. Pt continues to verbally refuse solids and move her head away when they are presented. Lacinda Axon, SNF was contacted by this SLP and advised that the pt was consuming a regular texture diet with thin liquids and did not demonstrate any symptoms of dysphagia or any need for SLP services prior to admission. Pt states that she does not want anything solid to eat so her current diet will be continued. SLP will sign off at this time, but pt's diet may be advanced to her baseline diet of regular texture solids and thin liquids as tolerated.    HPI HPI: Pt is a 72 yo female who presented due to altered mental status. Pt reportedly received COVID-19 vaccine on the day prior to admission and woke up moaning and answering the occasional no. At baseline, pt is reportedly alert and oriented x4. CT head negative. PMH: COPD, HTN, T2DM, and mild cognitive impairment.      SLP Plan  All goals met;Discharge SLP treatment due to (comment)      Recommendations for follow up therapy are one component of a multi-disciplinary discharge planning process, led by the attending physician.  Recommendations may be updated based on patient status, additional functional criteria and insurance authorization.    Recommendations  Diet recommendations: Thin liquid (continue clear liquids per pt's reqeust, but diet may be advanced to regular as  tolerated/desired) Medication Administration: Whole meds with liquid Compensations: Slow rate;Minimize environmental distractions Postural Changes and/or Swallow Maneuvers: Seated upright 90 degrees                  Oral care BID     Dysphagia, unspecified (R13.10)     All goals met;Discharge SLP treatment due to (comment)   Hansini Clodfelter I. Vear Clock, MS, CCC-SLP Acute Rehabilitation Services Office number [phone]   Scheryl Marten  09/19/2022, 10:25 AM

## 2022-09-20 DIAGNOSIS — N3000 Acute cystitis without hematuria: Secondary | ICD-10-CM | POA: Diagnosis not present

## 2022-09-20 LAB — BASIC METABOLIC PANEL
Anion gap: 9 (ref 5–15)
BUN: 11 mg/dL (ref 8–23)
CO2: 26 mmol/L (ref 22–32)
Calcium: 8.8 mg/dL — ABNORMAL LOW (ref 8.9–10.3)
Chloride: 101 mmol/L (ref 98–111)
Creatinine, Ser: 0.93 mg/dL (ref 0.44–1.00)
GFR, Estimated: >60 mL/min (ref >60)
Glucose, Bld: 131 mg/dL — ABNORMAL HIGH (ref 70–99)
Potassium: 3.5 mmol/L (ref 3.5–5.1)
Sodium: 136 mmol/L (ref 135–145)

## 2022-09-20 LAB — URINE CULTURE: Culture: >=100000 — AB

## 2022-09-20 LAB — CBC
HCT: 33.3 % — ABNORMAL LOW (ref 36.0–46.0)
Hemoglobin: 10.4 g/dL — ABNORMAL LOW (ref 12.0–15.0)
MCH: 28 pg (ref 26.0–34.0)
MCHC: 31.2 g/dL (ref 30.0–36.0)
MCV: 89.8 fL (ref 80.0–100.0)
Platelets: 185 10*3/uL (ref 150–400)
RBC: 3.71 MIL/uL — ABNORMAL LOW (ref 3.87–5.11)
RDW: 14.5 % (ref 11.5–15.5)
WBC: 4.6 10*3/uL (ref 4.0–10.5)
nRBC: 0 % (ref 0.0–0.2)

## 2022-09-20 LAB — CULTURE, BLOOD (ROUTINE X 2)

## 2022-09-20 MED ORDER — CARBAMIDE PEROXIDE 6.5 % OT SOLN: 5.0000 [drp] | Freq: Two times a day (BID) | OTIC | 0 refills | Status: AC

## 2022-09-20 MED ORDER — CEPHALEXIN 500 MG PO CAPS: 500.0000 mg | ORAL_CAPSULE | Freq: Two times a day (BID) | ORAL | 0 refills | Status: AC

## 2022-09-20 MED ORDER — ENSURE ENLIVE PO LIQD: 237.0000 mL | Freq: Three times a day (TID) | ORAL | 12 refills | Status: DC

## 2022-09-20 NOTE — Discharge Summary (Signed)
Name: Michelle Gay MRN: [id number] DOB: [birth year] 72 y.o. PCP: Michelle Mend, MD  Date of Admission: 09/17/2022 11:39 AM Date of Discharge: 09/20/2022 Attending Physician: Dr. Antony Gay  Discharge Diagnosis: Principal Problem:   UTI (urinary tract infection)   Discharge Medications: Allergies as of 09/20/2022   No Known Allergies      Medication List     STOP taking these medications    methotrexate 2.5 MG tablet Commonly known as: RHEUMATREX   valsartan-hydrochlorothiazide 160-12.5 MG tablet Commonly known as: DIOVAN-HCT       TAKE these medications    acetaminophen 500 MG tablet Commonly known as: TYLENOL Take 1,000 mg by mouth in the morning and at bedtime.   albuterol 108 (90 Base) MCG/ACT inhaler Commonly known as: VENTOLIN HFA Inhale 2 puffs into the lungs every 6 (six) hours as needed for shortness of breath.   aspirin 81 MG chewable tablet Chew 81 mg by mouth daily.   aspirin-acetaminophen-caffeine 250-250-65 MG tablet Commonly known as: EXCEDRIN MIGRAINE Take 1 tablet by mouth daily as needed for headache or migraine.   atorvastatin 10 MG tablet Commonly known as: LIPITOR Take 10 mg by mouth at bedtime.   BAZA PROTECT MOISTURE BARRIER EX Apply 1 Application topically in the morning and at bedtime.   Biofreeze Professional 5 % Gel Generic drug: Menthol (Topical Analgesic) Apply 1 Application topically in the morning and at bedtime.   budesonide 0.5 MG/2ML nebulizer solution Commonly known as: PULMICORT Take 0.5 mg by nebulization 2 times daily at 12 noon and 4 pm.   calcium carbonate 500 MG chewable tablet Commonly known as: TUMS - dosed in mg elemental calcium Chew 2 tablets by mouth daily.   carbamide peroxide 6.5 % OTIC solution Commonly known as: DEBROX Place 5 drops into the right ear 2 (two) times daily for 1 day.   cephALEXin 500 MG capsule Commonly known as: KEFLEX Take 1 capsule (500 mg total) by mouth 2 (two) times  daily for 2 days. Start taking on: Sep 21, 2022   Cholecalciferol 50 MCG (2000 UT) Tabs Take 2,000 Units by mouth daily.   cloNIDine 0.1 MG tablet Commonly known as: CATAPRES Take 0.1 mg by mouth every 12 (twelve) hours as needed (hypertension).   cyanocobalamin 500 MCG tablet Commonly known as: VITAMIN B12 Take 500 mcg by mouth daily.   cycloSPORINE 0.05 % ophthalmic emulsion Commonly known as: RESTASIS Place 2 drops into both eyes 2 (two) times daily.   diclofenac Sodium 1 % Gel Commonly known as: VOLTAREN Apply 4 g topically daily.   DULoxetine 60 MG capsule Commonly known as: CYMBALTA Take 60 mg by mouth daily.   famotidine 20 MG tablet Commonly known as: PEPCID Take 20 mg by mouth daily.   feeding supplement Liqd Take 237 mLs by mouth 3 (three) times daily between meals.   ferrous sulfate 325 (65 FE) MG tablet Take 325 mg by mouth daily with breakfast.   fluticasone 50 MCG/ACT nasal spray Commonly known as: FLONASE Place 1 spray into both nostrils daily.   folic acid 1 MG tablet Commonly known as: FOLVITE Take 1 mg by mouth daily.   gabapentin 800 MG tablet Commonly known as: NEURONTIN Take 800 mg by mouth 2 (two) times daily.   HM Lidocaine Patch 4 % Generic drug: lidocaine Place 0.5 patches onto the skin daily.   ipratropium 0.02 % nebulizer solution Commonly known as: ATROVENT Take 0.5 mg by nebulization in the morning, at noon, and at bedtime.  montelukast 10 MG tablet Commonly known as: SINGULAIR Take 10 mg by mouth daily.   oxyCODONE 5 MG immediate release tablet Commonly known as: Oxy IR/ROXICODONE Take 2.5 mg by mouth daily.   Ozempic (0.25 or 0.5 MG/DOSE) 2 MG/3ML Sopn Generic drug: Semaglutide(0.25 or 0.5MG /DOS) Inject 0.5 mg into the skin once a week. Tuesdays   polyethylene glycol 17 g packet Commonly known as: MIRALAX / GLYCOLAX Take 17 g by mouth daily.   sennosides-docusate sodium 8.6-50 MG tablet Commonly known as:  SENOKOT-S Take 2 tablets by mouth daily.   tiZANidine 4 MG tablet Commonly known as: ZANAFLEX Take 2 mg by mouth in the morning and at bedtime.   topiramate 25 MG tablet Commonly known as: TOPAMAX Take 25 mg by mouth 2 (two) times daily.         Disposition and follow-up:   Michelle Gay was discharged from [facility] in stable condition.  At the hospital follow up visit please address:  1.  Follow-up: #Altered mental status secondary to sepsis likely due to UTI  Ensure patient finished last dose of cephalexin on 09/23/2022. Monitor for fevers, treat with tylenol prn.   #Normocytic anemia Follow with a CBC on D/C. Monitor for hematochezia or melena and follow up with a colonoscopy if needed.   #Ear pain Patient has a RIGHT ear impaction with cerumen. Ear pain has improved. Continue carbamide peroxide drops for RIGHT ear impaction 5 drops BID on RIGHT ear (stop on 09/22/2022)  2.  Labs / imaging needed at time of follow-up: CBC  3.  Pending labs/ test needing follow-up: Blood culture  4.  Medication Changes  Hold Methotrexate 2.5 mg and valsartan-hydrochlorothiazide 160-12.5 MG tablet until 09/23/2022.   Follow-up Appointments: Close follow up with PCP.  Hospital Course by problem list:   Michelle Gay is a 72 yo female with a pertinent PMH of COPD, HTN, T2DM, and mild cognitive impairment who presented to the ED by EMS due to altered mental status and decreased oxygen saturation, who was admitted for sepsis secondary to a UTI.   Altered mental status secondary to sepsis likely due to UTI: Patient presented with AMS, fever of 103 F, tachypnea of 22 rr, and tachycardia of 105 bpm, which is consistent with SIRS. UA showed pyuria and patient had diffuse abdominal tenderness on exam making the source of infection a UTI. In the ED, patient received duonebs, Flagyl, Vancomycin, and Cefepime. Started on LR bolus of 1,000 mL and continuous 150 mL/hr infusion.  Received tylenol suppository, temperature decreased to 99.1 F and was HD stable with maps in the 80s to 90s and O2 sats in the higher 90s on room air.  Blood cultures originally showed gram-positive cocci in 1 out of 4 blood culture bottles growing staph hominis which is a likely contaminant. Repeat blood cultures were drawn and have had no growth. Presenting creatinine was 1.00 which rose to 1.13. Suspect this was prerenal secondary to decreased PO intake. Patient was given LR boluses for mildly elevated creatinine with dry mucous membranes which normalized to 0.93. Patient presented with a hemoglobin of 11.7 that decreased to 9.9. Patient had no signs of active bleeding, but nurse did note that patient had a BM with dark stool. Hb increased to 10.4 on D/C making it most likely an from effect of fluids. Leukocytes normalized to 4.6 from 3.8. Urine culture was positive for pansensitive E.coli. Patient was started on Cefepime which was further narrowed to ceftriaxone. D/C with oral cephalexin  to start on 09/21/2022.   Ear pain Patient reported ear pain on HD#1. On exam, patient was found to have a RIGHT ear impaction with cerumen and a effusion on the LEFT ear. In addition to Ceftriaxone for UTI, carbamide peroxide drops were given for RIGHT ear impaction. Patient had improvement of ear pain on HD#2.   COPD Patient had no evidence of a COPD exacerbation, oxygen goal was 88-92%. Patient did not requiring O2 supplementation via nasal cannula during the day, but was noted to require it once at night for low oxygen saturations. Was given Budesonide (pulmicort) 0.5mg  q12 hours, Albuterol 2puff q6hrs prn, and Ipratropium 1inhalation 3 times daily. We held the Montelukast 10 mg daily.   HTN Patient was normotensive during her admission. We held valsartan-HCTZ 160-12.5mg  daily and ASA 81.   Discharge Subjective: Patient is awake and alert today watching TV. Replied to all our questions and complained of runny  stools 3x a day since last night, which is most likely due to antibiotic side effect. Patient reports not having an appetite, but endorsed willing to try and eat grits today. Patient reported pins and needles on her abdomen when its palpated due to the Lovenox injections.   Discharge Exam:   BP 134/60 (BP Location: Left Wrist)   Pulse 72   Temp 98.9 F (37.2 C) (Oral)   Resp 19   Ht 5\' 3"  (1.6 m)   Wt (!) 158.8 kg   SpO2 98%   BMI 62.00 kg/m  Constitutional: well-appearing elderly female laying in bed, in no acute distress HENT: normocephalic atraumatic, mucous membranes moist Eyes: conjunctiva non-erythematous Neck: supple Cardiovascular: regular rate and rhythm, no m/r/g Pulmonary/Chest: normal work of breathing on room air, lungs clear to auscultation bilaterally Abdominal: soft, non-tender, non-distended MSK: normal bulk and tone Neurological: alert & oriented x 3, 5/5 strength in bilateral upper and lower extremities, normal gait Skin: warm and dry Psych: normal mood   Pertinent Labs, Studies, and Procedures:     Latest Ref Rng & Units 09/20/2022    1:21 AM 09/19/2022    1:40 AM 09/18/2022    1:48 AM  CBC  WBC 4.0 - 10.5 K/uL 4.6  5.9  3.8   Hemoglobin 12.0 - 15.0 g/dL 40.9  9.9  81.1   Hematocrit 36.0 - 46.0 % 33.3  31.7  35.8   Platelets 150 - 400 K/uL 185  196  192        Latest Ref Rng & Units 09/20/2022    1:21 AM 09/19/2022    1:40 AM 09/18/2022    1:48 AM  CMP  Glucose 70 - 99 mg/dL 914  782  956   BUN 8 - 23 mg/dL 11  15  15    Creatinine 0.44 - 1.00 mg/dL 2.13  0.86  5.78   Sodium 135 - 145 mmol/L 136  138  137   Potassium 3.5 - 5.1 mmol/L 3.5  3.5  3.8   Chloride 98 - 111 mmol/L 101  101  102   CO2 22 - 32 mmol/L 26  24  25    Calcium 8.9 - 10.3 mg/dL 8.8  8.8  8.8     CT HEAD WO CONTRAST ( )  Result Date: 09/17/2022 CLINICAL DATA:  Acute delirium EXAM: CT HEAD WITHOUT CONTRAST TECHNIQUE: Contiguous axial images were obtained from the base of the skull  through the vertex without intravenous contrast. RADIATION DOSE REDUCTION: This exam was performed according to the departmental dose-optimization program which includes automated  exposure control, adjustment of the mA and/or kV according to patient size and/or use of iterative reconstruction technique. COMPARISON:  08/19/2017 FINDINGS: Brain: Age related brain atrophy and scattered areas of white matter microvascular ischemic changes throughout both cerebral hemispheres. No acute intracranial hemorrhage, new mass lesion, acute infarction, midline shift, herniation, hydrocephalus, or extra-axial fluid collection. No focal mass effect or edema. Cisterns are patent. Cerebellar atrophy as well. Vascular: No hyperdense vessel or unexpected calcification. Skull: Normal. Negative for fracture or focal lesion. Sinuses/Orbits: No acute finding. Other: Bilateral mastoid effusions noted. IMPRESSION: 1. No acute intracranial abnormality by noncontrast CT. 2. Age related brain atrophy and white matter microvascular ischemic changes. 3. Bilateral mastoid effusions. Electronically Signed   By: Judie Petit.  Shick M.D.   On: 09/17/2022 12:48   DG Chest Port 1 View  Result Date: 09/17/2022 CLINICAL DATA:  Possible sepsis EXAM: PORTABLE CHEST 1 VIEW COMPARISON:  None Available. FINDINGS: Cardiac size is in the upper limits of normal. There are no signs of alveolar pulmonary edema or focal pulmonary consolidation. Patient's chin is partially obscuring the apices. There is no pleural effusion or pneumothorax. IMPRESSION: There are no focal infiltrates or signs of pulmonary edema. Electronically Signed   By: Ernie Avena M.D.   On: 09/17/2022 12:24     Signed: Annette Stable, Medical Student 09/20/2022, 9:49 AM   Pager: [phone]

## 2022-09-20 NOTE — NC FL2 (Signed)
[place] MEDICAID FL2 LEVEL OF CARE FORM     IDENTIFICATION  Patient Name: Michelle Gay Birthdate: 19-Nov-1950 Sex: female Admission Date (Current Location): 09/17/2022  [facility] and [place] Number:  Producer, television/film/video and Address:  [facility], [address] [place], [place] [address]      Provider Number: [id number]  Attending Physician Name and Address:  Reymundo Poll, MD  Relative Name and Phone Number:       Current Level of Care: Hospital Recommended Level of Care: Skilled Nursing Facility Prior Approval Number:    Date Approved/Denied:   PASRR Number: [id number]  Discharge Plan: SNF    Current Diagnoses: Patient Active Problem List   Diagnosis Date Noted   UTI (urinary tract infection) 09/17/2022    Orientation RESPIRATION BLADDER Height & Weight     Self  Normal Incontinent Weight: (!) 350 lb (158.8 kg) Height:  5\' 3"  (160 cm)  BEHAVIORAL SYMPTOMS/MOOD NEUROLOGICAL BOWEL NUTRITION STATUS      Incontinent Diet (See dc summary)  AMBULATORY STATUS COMMUNICATION OF NEEDS Skin   Extensive Assist Verbally Other (Comment) (MASD on abdomen)                       Personal Care Assistance Level of Assistance  Bathing, Feeding, Dressing Bathing Assistance: Maximum assistance Feeding assistance: Limited assistance Dressing Assistance: Maximum assistance     Functional Limitations Info  Hearing   Hearing Info: Impaired      SPECIAL CARE FACTORS FREQUENCY                       Contractures Contractures Info: Not present    Additional Factors Info  Code Status, Allergies, Psychotropic Code Status Info: Full Allergies Info: NKA Psychotropic Info: Cymbalta         Current Medications (09/20/2022):  This is the current hospital active medication list Current Facility-Administered Medications  Medication Dose Route Frequency Provider Last Rate Last Admin   acetaminophen (TYLENOL) tablet 650 mg  650 mg Oral  Q6H PRN Crissie Sickles, MD       budesonide (PULMICORT) nebulizer solution 0.5 mg  0.5 mg Nebulization BID Champ Mungo, DO   0.5 mg at 09/20/22 0840   carbamide peroxide (DEBROX) 6.5 % OTIC (EAR) solution 5 drop  5 drop Right EAR BID Doran Stabler, DO   5 drop at 09/20/22 1053   cefTRIAXone (ROCEPHIN) 2 g in sodium chloride 0.9 % 100 mL IVPB  2 g Intravenous Q24H Heide Spark, Nischal, MD 200 mL/hr at 09/20/22 1048 2 g at 09/20/22 1048   DULoxetine (CYMBALTA) DR capsule 60 mg  60 mg Oral Daily Crissie Sickles, MD   60 mg at 09/20/22 1051   enoxaparin (LOVENOX) injection 80 mg  80 mg Subcutaneous Q24H Champ Mungo, DO   80 mg at 09/19/22 2111   feeding supplement (ENSURE ENLIVE / ENSURE PLUS) liquid 237 mL  237 mL Oral TID BM Champ Mungo, DO   237 mL at 09/19/22 2112   Gerhardt's butt cream   Topical TID PRN Crissie Sickles, MD       ipratropium (ATROVENT) nebulizer solution 0.5 mg  0.5 mg Nebulization BID Earl Lagos, MD   0.5 mg at 09/20/22 0840   lidocaine (LIDODERM) 5 % 1 patch  1 patch Transdermal Q24H Willette Cluster, MD   1 patch at 09/19/22 1849     Discharge Medications: Please see discharge summary for a list of discharge medications.  Relevant Imaging Results:  Relevant Lab Results:   Additional Information SSN: [id number] [place], [place]

## 2022-09-20 NOTE — Progress Notes (Signed)
Report called to savannah at [place].

## 2022-09-20 NOTE — TOC Transition Note (Addendum)
Transition of Care [facility]) - CM/SW Discharge Note   Patient Details  Name: Michelle Gay MRN: [id number] Date of Birth: 1951/02/02  [facility] CM/SW Contact:  Georgie Chard, LCSW Phone Number: 09/20/2022, 11:43 AM   Clinical Narrative:    CSW spoke to facility [facility] patient is going to bed 112 B report number [phone] care team was made aware [facility] called.   Final next level of care: Skilled Nursing Facility Barriers to Discharge: No Barriers Identified   Patient Goals and CMS Choice      Discharge Placement                Patient chooses bed at: [facility] Patient to be transferred to facility by: [facility]   Patient and family notified of of transfer: 09/20/22  Discharge Plan and Services Additional resources added to the After Visit Summary for                                       Social Determinants of Health (SDOH) Interventions SDOH Screenings   Food Insecurity: No Food Insecurity (09/20/2022)  Transportation Needs: No Transportation Needs (09/20/2022)  Tobacco Use: Unknown (09/17/2022)     Readmission Risk Interventions     No data to display

## 2022-09-20 NOTE — Progress Notes (Signed)
PT Cancellation Note  Patient Details Name: Michelle Gay MRN: [id number] DOB: Mar 30, 1951   Cancelled Treatment:    Reason Eval/Treat Not Completed: PT screened, no needs identified, will sign off. Pt from SNF and talking to her she is either totally bedbound or if she does get OOB it is with mechanical lift.    Angelina Ok [facility] 09/20/2022, 8:27 AM Skip Mayer PT Acute [employer] [phone]

## 2022-09-21 LAB — CULTURE, BLOOD (SINGLE)

## 2022-09-21 LAB — CULTURE, BLOOD (ROUTINE X 2)

## 2022-09-22 LAB — CULTURE, BLOOD (SINGLE)

## 2022-09-22 LAB — CULTURE, BLOOD (ROUTINE X 2)
Culture: NO GROWTH
Special Requests: ADEQUATE

## 2022-09-23 LAB — CULTURE, BLOOD (SINGLE): Culture: NO GROWTH

## 2022-09-23 LAB — CULTURE, BLOOD (ROUTINE X 2)

## 2022-12-08 ENCOUNTER — Emergency Department (HOSPITAL_COMMUNITY): Payer: Medicare Other

## 2022-12-08 ENCOUNTER — Inpatient Hospital Stay (HOSPITAL_COMMUNITY)
Admission: EM | Admit: 2022-12-08 | Discharge: 2022-12-15 | DRG: 871 | Disposition: A | Discharge status: Skilled Nursing Facility | Payer: Medicare Other | Attending: Internal Medicine | Admitting: Internal Medicine

## 2022-12-08 ENCOUNTER — Encounter (HOSPITAL_COMMUNITY): Payer: Self-pay | Admitting: Emergency Medicine

## 2022-12-08 DIAGNOSIS — Z7985 Long-term (current) use of injectable non-insulin antidiabetic drugs: Secondary | ICD-10-CM

## 2022-12-08 DIAGNOSIS — R4182 Altered mental status, unspecified: Secondary | ICD-10-CM | POA: Diagnosis not present

## 2022-12-08 DIAGNOSIS — N390 Urinary tract infection, site not specified: Secondary | ICD-10-CM | POA: Diagnosis present

## 2022-12-08 DIAGNOSIS — A4151 Sepsis due to Escherichia coli [E. coli]: Principal | ICD-10-CM | POA: Diagnosis present

## 2022-12-08 DIAGNOSIS — Z7982 Long term (current) use of aspirin: Secondary | ICD-10-CM

## 2022-12-08 DIAGNOSIS — Z79899 Other long term (current) drug therapy: Secondary | ICD-10-CM

## 2022-12-08 DIAGNOSIS — L89302 Pressure ulcer of unspecified buttock, stage 2: Secondary | ICD-10-CM | POA: Diagnosis present

## 2022-12-08 DIAGNOSIS — Z1611 Resistance to penicillins: Secondary | ICD-10-CM | POA: Diagnosis present

## 2022-12-08 DIAGNOSIS — Z7951 Long term (current) use of inhaled steroids: Secondary | ICD-10-CM

## 2022-12-08 DIAGNOSIS — G3184 Mild cognitive impairment, so stated: Secondary | ICD-10-CM | POA: Diagnosis present

## 2022-12-08 DIAGNOSIS — Z1623 Resistance to quinolones and fluoroquinolones: Secondary | ICD-10-CM | POA: Diagnosis present

## 2022-12-08 DIAGNOSIS — G9340 Encephalopathy, unspecified: Secondary | ICD-10-CM | POA: Diagnosis present

## 2022-12-08 DIAGNOSIS — R6520 Severe sepsis without septic shock: Secondary | ICD-10-CM | POA: Diagnosis present

## 2022-12-08 DIAGNOSIS — J449 Chronic obstructive pulmonary disease, unspecified: Secondary | ICD-10-CM | POA: Diagnosis present

## 2022-12-08 DIAGNOSIS — M069 Rheumatoid arthritis, unspecified: Secondary | ICD-10-CM | POA: Diagnosis present

## 2022-12-08 DIAGNOSIS — E119 Type 2 diabetes mellitus without complications: Secondary | ICD-10-CM | POA: Diagnosis present

## 2022-12-08 DIAGNOSIS — G9341 Metabolic encephalopathy: Secondary | ICD-10-CM | POA: Diagnosis present

## 2022-12-08 DIAGNOSIS — Z6841 Body Mass Index (BMI) 40.0 and over, adult: Secondary | ICD-10-CM

## 2022-12-08 DIAGNOSIS — A419 Sepsis, unspecified organism: Principal | ICD-10-CM

## 2022-12-08 DIAGNOSIS — F05 Delirium due to known physiological condition: Secondary | ICD-10-CM | POA: Diagnosis present

## 2022-12-08 DIAGNOSIS — Z1629 Resistance to other single specified antibiotic: Secondary | ICD-10-CM | POA: Diagnosis present

## 2022-12-08 DIAGNOSIS — Z1152 Encounter for screening for COVID-19: Secondary | ICD-10-CM

## 2022-12-08 DIAGNOSIS — I10 Essential (primary) hypertension: Secondary | ICD-10-CM | POA: Diagnosis present

## 2022-12-08 HISTORY — DX: Morbid (severe) obesity due to excess calories: E66.01

## 2022-12-08 HISTORY — DX: Mild cognitive impairment of uncertain or unknown etiology: G31.84

## 2022-12-08 HISTORY — DX: Chronic obstructive pulmonary disease, unspecified: J44.9

## 2022-12-08 HISTORY — DX: Type 2 diabetes mellitus without complications: E11.9

## 2022-12-08 HISTORY — DX: Essential (primary) hypertension: I10

## 2022-12-08 LAB — TROPONIN I (HIGH SENSITIVITY)
Troponin I (High Sensitivity): 8 ng/L (ref <18)
Troponin I (High Sensitivity): 6 ng/L (ref <18)

## 2022-12-08 LAB — CBC WITH DIFFERENTIAL/PLATELET
Abs Immature Granulocytes: 0.03 10*3/uL (ref 0.00–0.07)
Basophils Absolute: 0 10*3/uL (ref 0.0–0.1)
Basophils Relative: 0 %
Eosinophils Absolute: 0 10*3/uL (ref 0.0–0.5)
Eosinophils Relative: 1 %
HCT: 37 % (ref 36.0–46.0)
Hemoglobin: 10.7 g/dL — ABNORMAL LOW (ref 12.0–15.0)
Immature Granulocytes: 0 %
Lymphocytes Relative: 29 %
Lymphs Abs: 2.1 10*3/uL (ref 0.7–4.0)
MCH: 27.6 pg (ref 26.0–34.0)
MCHC: 28.9 g/dL — ABNORMAL LOW (ref 30.0–36.0)
MCV: 95.4 fL (ref 80.0–100.0)
Monocytes Absolute: 0.7 10*3/uL (ref 0.1–1.0)
Monocytes Relative: 9 %
Neutro Abs: 4.5 10*3/uL (ref 1.7–7.7)
Neutrophils Relative %: 61 %
Platelets: 231 10*3/uL (ref 150–400)
RBC: 3.88 MIL/uL (ref 3.87–5.11)
RDW: 15.3 % (ref 11.5–15.5)
WBC: 7.4 10*3/uL (ref 4.0–10.5)
nRBC: 0 % (ref 0.0–0.2)

## 2022-12-08 LAB — COMPREHENSIVE METABOLIC PANEL
ALT: 27 U/L (ref 0–44)
AST: 27 U/L (ref 15–41)
Albumin: 2.9 g/dL — ABNORMAL LOW (ref 3.5–5.0)
Alkaline Phosphatase: 96 U/L (ref 38–126)
Anion gap: 19 — ABNORMAL HIGH (ref 5–15)
BUN: 21 mg/dL (ref 8–23)
CO2: 22 mmol/L (ref 22–32)
Calcium: 10 mg/dL (ref 8.9–10.3)
Chloride: 98 mmol/L (ref 98–111)
Creatinine, Ser: 1.25 mg/dL — ABNORMAL HIGH (ref 0.44–1.00)
GFR, Estimated: 46 mL/min — ABNORMAL LOW (ref >60)
Glucose, Bld: 160 mg/dL — ABNORMAL HIGH (ref 70–99)
Potassium: 4.2 mmol/L (ref 3.5–5.1)
Sodium: 139 mmol/L (ref 135–145)
Total Bilirubin: 0.5 mg/dL (ref 0.3–1.2)
Total Protein: 8.1 g/dL (ref 6.5–8.1)

## 2022-12-08 LAB — I-STAT VENOUS BLOOD GAS, ED
Acid-Base Excess: 1 mmol/L (ref 0.0–2.0)
Bicarbonate: 27.3 mmol/L (ref 20.0–28.0)
Calcium, Ion: 1.24 mmol/L (ref 1.15–1.40)
HCT: 37 % (ref 36.0–46.0)
Hemoglobin: 12.6 g/dL (ref 12.0–15.0)
O2 Saturation: 99 %
Potassium: 4.3 mmol/L (ref 3.5–5.1)
Sodium: 140 mmol/L (ref 135–145)
TCO2: 29 mmol/L (ref 22–32)
pCO2, Ven: 49.9 mmHg (ref 44–60)
pH, Ven: 7.346 (ref 7.25–7.43)
pO2, Ven: 163 mmHg — ABNORMAL HIGH (ref 32–45)

## 2022-12-08 LAB — RESP PANEL BY RT-PCR (RSV, FLU A&B, COVID)  RVPGX2
Influenza A by PCR: NEGATIVE
Influenza B by PCR: NEGATIVE
Resp Syncytial Virus by PCR: NEGATIVE
SARS Coronavirus 2 by RT PCR: NEGATIVE

## 2022-12-08 LAB — URINALYSIS, ROUTINE W REFLEX MICROSCOPIC
Bilirubin Urine: NEGATIVE
Glucose, UA: NEGATIVE mg/dL
Ketones, ur: NEGATIVE mg/dL
Nitrite: NEGATIVE
Protein, ur: 100 mg/dL — AB
RBC / HPF: >50 RBC/hpf (ref 0–5)
Specific Gravity, Urine: 1.028 (ref 1.005–1.030)
WBC, UA: >50 WBC/hpf (ref 0–5)
pH: 5 (ref 5.0–8.0)

## 2022-12-08 LAB — I-STAT CG4 LACTIC ACID, ED: Lactic Acid, Venous: 1.7 mmol/L (ref 0.5–1.9)

## 2022-12-08 LAB — LIPASE, BLOOD: Lipase: 35 U/L (ref 11–51)

## 2022-12-08 LAB — PROTIME-INR
INR: 1.2 (ref 0.8–1.2)
Prothrombin Time: 15.4 seconds — ABNORMAL HIGH (ref 11.4–15.2)

## 2022-12-08 LAB — APTT: aPTT: 33 seconds (ref 24–36)

## 2022-12-08 MED ORDER — LACTATED RINGERS IV SOLN: INTRAVENOUS | Status: AC

## 2022-12-08 MED ORDER — SODIUM CHLORIDE 0.9 % IV SOLN
1.0000 g | Freq: Once | INTRAVENOUS | Status: AC
  Administered 2022-12-08: 1 g via INTRAVENOUS
  Filled 2022-12-08: qty 10

## 2022-12-08 MED ORDER — METRONIDAZOLE 500 MG/100ML IV SOLN
500.0000 mg | Freq: Two times a day (BID) | INTRAVENOUS | Status: DC
  Administered 2022-12-08 – 2022-12-10 (×5): 500 mg via INTRAVENOUS
  Filled 2022-12-08 (×6): qty 100

## 2022-12-08 NOTE — ED Notes (Signed)
Kourtney niece, legal guardian.. please call with updates [phone]

## 2022-12-08 NOTE — ED Triage Notes (Signed)
Pt BIB [facility] from [facility] due to altered mental status.  Pt having delay speaking.  LKW 2100 12/07/22. Pt currently has cough.   VS BP 144/98, HR 106, resp 20, SpO 85% RA. Normally not on oxygen at baseline but currently 3L nasal cannula 96%.

## 2022-12-08 NOTE — ED Notes (Signed)
CT called about ordered imaging

## 2022-12-08 NOTE — ED Provider Notes (Signed)
[place] EMERGENCY DEPARTMENT AT [facility] Provider Note   CSN: [id number] Arrival date & time: 12/08/22  1527     History  Chief Complaint  Patient presents with   Altered Mental Status    Michelle Gay is a 72 y.o. female.  Michelle Gay is a 72 yo female with a pertinent PMH of COPD, HTN, T2DM, and mild cognitive impairment who presented to the ED by EMS due to altered mental status.  Patient admits to nausea, vomiting, and lower abdominal pain over the last several days.  Admits to increased urinary frequency.  No dysuria.  No flank pain.  Patient found to be hypoxic at 85% on room air.  On arrival to emergency department patient satting at 97%.  Denies any fevers, chills, coughing.  The history is provided by the patient.  Altered Mental Status      Home Medications Prior to Admission medications   Medication Sig Start Date End Date Taking? Authorizing Provider  acetaminophen (TYLENOL) 500 MG tablet Take 1,000 mg by mouth in the morning and at bedtime.   Yes [provider]  albuterol (VENTOLIN HFA) 108 (90 Base) MCG/ACT inhaler Inhale 2 puffs into the lungs every 6 (six) hours as needed for shortness of breath. 09/01/22  Yes [provider]  aspirin 81 MG chewable tablet Chew 81 mg by mouth daily.   Yes [provider]  aspirin-acetaminophen-caffeine (EXCEDRIN MIGRAINE) [id number] MG tablet Take 1 tablet by mouth daily as needed for headache or migraine.   Yes [provider]  atorvastatin (LIPITOR) 10 MG tablet Take 10 mg by mouth at bedtime. 08/10/22  Yes [provider]  Benzocaine-Menthol (CEPACOL SORE THROAT) 10-2.1 MG LOZG Use as directed 1 lozenge in the mouth or throat daily.   Yes [provider]  budesonide (PULMICORT) 0.5 MG/2ML nebulizer solution Take 0.5 mg by nebulization 2 times daily at 12 noon and 4 pm. 09/09/22  Yes [provider]  calcium carbonate (TUMS - DOSED IN MG ELEMENTAL  CALCIUM) 500 MG chewable tablet Chew 2 tablets by mouth daily.   Yes [provider]  Cholecalciferol 50 MCG (2000 UT) TABS Take 2,000 Units by mouth daily.   Yes [provider]  cloNIDine (CATAPRES) 0.1 MG tablet Take 0.1 mg by mouth every 12 (twelve) hours as needed (hypertension). If SBP > 160 or DBP >100. If BP >160/100 1 hour after administration call MD.   Yes [provider]  cycloSPORINE (RESTASIS) 0.05 % ophthalmic emulsion Place 2 drops into both eyes 2 (two) times daily. 07/26/22  Yes [provider]  diclofenac Sodium (VOLTAREN) 1 % GEL Apply 4 g topically daily. To bilateral hands 07/04/22  Yes [provider]  DULoxetine (CYMBALTA) 60 MG capsule Take 60 mg by mouth daily. 08/10/22  Yes [provider]  famotidine (PEPCID) 20 MG tablet Take 20 mg by mouth daily. 08/10/22  Yes [provider]  ferrous sulfate 325 (65 FE) MG tablet Take 325 mg by mouth daily with breakfast.   Yes [provider]  fluticasone (FLONASE) 50 MCG/ACT nasal spray Place 1 spray into both nostrils daily.   Yes [provider]  folic acid (FOLVITE) 1 MG tablet Take 1 mg by mouth daily.   Yes [provider]  gabapentin (NEURONTIN) 800 MG tablet Take 800 mg by mouth 2 (two) times daily. 08/10/22  Yes [provider]  ipratropium (ATROVENT) 0.02 % nebulizer solution Take 0.5 mg by nebulization in the morning,  at noon, and at bedtime. 09/03/22  Yes [provider]  lidocaine (HM LIDOCAINE PATCH) 4 % Place 0.5 patches onto the skin daily.   Yes [provider]  Menthol, Topical Analgesic, (BIOFREEZE PROFESSIONAL) 5 % GEL Apply 1 Application topically in the morning and at bedtime. To left arm & shoulder   Yes [provider]  methotrexate (RHEUMATREX) 2.5 MG tablet Take 5 mg by mouth once a week. Caution:Chemotherapy. Protect from light. Tuesdays   Yes [provider]  montelukast (SINGULAIR)  10 MG tablet Take 10 mg by mouth daily. 08/10/22  Yes [provider]  oxyCODONE (OXY IR/ROXICODONE) 5 MG immediate release tablet Take 5 mg by mouth 2 (two) times daily. 08/28/22  Yes [provider]  OZEMPIC, 0.25 OR 0.5 MG/DOSE, 2 MG/3ML SOPN Inject 0.5 mg into the skin once a week. Tuesdays 08/13/22  Yes [provider]  polyethylene glycol (MIRALAX / GLYCOLAX) 17 g packet Take 17 g by mouth daily.   Yes [provider]  sennosides-docusate sodium (SENOKOT-S) 8.6-50 MG tablet Take 2 tablets by mouth daily.   Yes [provider]  tiZANidine (ZANAFLEX) 4 MG tablet Take 2 mg by mouth in the morning and at bedtime. 08/10/22  Yes [provider]  topiramate (TOPAMAX) 25 MG tablet Take 25 mg by mouth 2 (two) times daily. 08/10/22  Yes [provider]  valsartan (DIOVAN) 80 MG tablet Take 80 mg by mouth daily.   Yes [provider]  Zinc Oxide (BAZA PROTECT MOISTURE BARRIER EX) Apply 1 Application topically in the morning and at bedtime.   Yes [provider]  feeding supplement (ENSURE ENLIVE / ENSURE PLUS) LIQD Take 237 mLs by mouth 3 (three) times daily between meals. Patient not taking: Reported on 12/08/2022 09/20/22   Champ Mungo, DO      Allergies    Patient has no known allergies.    Review of Systems   Review of Systems  Unable to perform ROS: Mental status change    Physical Exam Updated Vital Signs BP 98/81   Pulse (!) 107   Temp 98.4 F (36.9 C) (Oral)   Resp 20   Ht 5\' 4"  (1.626 m)   Wt (!) 158.8 kg Comment: pt unable to answer  SpO2 97%   BMI 60.08 kg/m  Physical Exam Vitals and nursing note reviewed.  Constitutional:      General: She is not in acute distress.    Appearance: She is well-developed.  HENT:     Head: Normocephalic and atraumatic.  Eyes:     Conjunctiva/sclera: Conjunctivae normal.  Cardiovascular:     Rate and Rhythm: Normal rate and regular rhythm.     Heart sounds: No  murmur heard. Pulmonary:     Effort: Pulmonary effort is normal. No respiratory distress.     Breath sounds: Normal breath sounds.  Abdominal:     Palpations: Abdomen is soft.     Tenderness: There is abdominal tenderness in the suprapubic area. There is no guarding or rebound.  Musculoskeletal:        General: No swelling.     Cervical back: Neck supple.  Skin:    General: Skin is warm and dry.     Capillary Refill: Capillary refill takes less than 2 seconds.  Neurological:     Mental Status: She is alert.     GCS: GCS eye subscore is 4. GCS verbal subscore is 4. GCS motor subscore is 6.  Cranial Nerves: Cranial nerves 2-12 are intact.     Sensory: Sensation is intact.     Motor: Motor function is intact.  Psychiatric:        Mood and Affect: Mood normal.     ED Results / Procedures / Treatments   Labs (all labs ordered are listed, but only abnormal results are displayed) Labs Reviewed  CBC WITH DIFFERENTIAL/PLATELET - Abnormal; Notable for the following components:      Result Value   Hemoglobin 10.7 (*)    MCHC 28.9 (*)    All other components within normal limits  COMPREHENSIVE METABOLIC PANEL - Abnormal; Notable for the following components:   Glucose, Bld 160 (*)    Creatinine, Ser 1.25 (*)    Albumin 2.9 (*)    GFR, Estimated 46 (*)    Anion gap 19 (*)    All other components within normal limits  URINALYSIS, ROUTINE W REFLEX MICROSCOPIC - Abnormal; Notable for the following components:   APPearance TURBID (*)    Hgb urine dipstick LARGE (*)    Protein, ur 100 (*)    Leukocytes,Ua MODERATE (*)    Bacteria, UA MANY (*)    All other components within normal limits  PROTIME-INR - Abnormal; Notable for the following components:   Prothrombin Time 15.4 (*)    All other components within normal limits  I-STAT VENOUS BLOOD GAS, ED - Abnormal; Notable for the following components:   pO2, Ven 163 (*)    All other components within normal limits  RESP PANEL BY  RT-PCR (RSV, FLU A&B, COVID)  [id number]  CULTURE, BLOOD (ROUTINE X 2)  CULTURE, BLOOD (ROUTINE X 2)  LIPASE, BLOOD  APTT  I-STAT CG4 LACTIC ACID, ED  I-STAT CG4 LACTIC ACID, ED  TROPONIN I (HIGH SENSITIVITY)  TROPONIN I (HIGH SENSITIVITY)    EKG None  Radiology CT Head Wo Contrast  Result Date: 12/08/2022 CLINICAL DATA:  Altered mental status, nontraumatic (Ped 0-17y) EXAM: CT HEAD WITHOUT CONTRAST TECHNIQUE: Contiguous axial images were obtained from the base of the skull through the vertex without intravenous contrast. RADIATION DOSE REDUCTION: This exam was performed according to the departmental dose-optimization program which includes automated exposure control, adjustment of the mA and/or kV according to patient size and/or use of iterative reconstruction technique. COMPARISON:  09/17/2022 FINDINGS: Brain: No acute intracranial abnormality. Specifically, no hemorrhage, hydrocephalus, mass lesion, acute infarction, or significant intracranial injury. Vascular: No hyperdense vessel or unexpected calcification. Skull: No acute calvarial abnormality. Sinuses/Orbits: No acute findings Other: None IMPRESSION: No acute intracranial abnormality. Electronically Signed   By: Charlett Nose M.D.   On: 12/08/2022 20:35   DG Chest Portable 1 View  Result Date: 12/08/2022 CLINICAL DATA:  Shortness of breath. EXAM: PORTABLE CHEST 1 VIEW COMPARISON:  Sep 17, 2022. FINDINGS: Stable cardiomegaly. No acute pulmonary disease is noted. Bony thorax is unremarkable. IMPRESSION: No active disease. Electronically Signed   By: Lupita Raider M.D.   On: 12/08/2022 18:12    Procedures .Critical Care  Performed by: Franne Forts, DO Authorized by: Franne Forts, DO   Critical care provider statement:    Critical care time (minutes):  100   Critical care was necessary to treat or prevent imminent or life-threatening deterioration of the following conditions:  Sepsis   Critical care was time spent personally by  me on the following activities:  Development of treatment plan with patient or surrogate, discussions with consultants, evaluation of patient's response to treatment, examination of  patient, ordering and review of laboratory studies, ordering and review of radiographic studies, ordering and performing treatments and interventions, pulse oximetry, re-evaluation of patient's condition and review of old charts     Medications Ordered in ED Medications  lactated ringers infusion ( Intravenous New Bag/Given 12/08/22 1717)  metroNIDAZOLE (FLAGYL) IVPB 500 mg (500 mg Intravenous New Bag/Given 12/08/22 2116)  cefTRIAXone (ROCEPHIN) 1 g in sodium chloride 0.9 % 100 mL IVPB (0 g Intravenous Stopped 12/08/22 2003)    ED Course/ Medical Decision Making/ A&P                             Medical Decision Making Amount and/or Complexity of Data Reviewed Labs: ordered. Radiology: ordered. ECG/medicine tests: ordered.  Risk Prescription drug management. Decision regarding hospitalization.   72 year old female denting for altered mental status.  On exam patient is alert and oriented x 2, moving all 4 extremities without difficulty.  Low concern for stroke.  Concern for sepsis due to tachycardia with a rate of 128 on arrival with low blood pressures reading 89/64.  Code sepsis activated.  IV fluids started.  Rocephin and Flagyl given.  Blood cultures obtained.  Concerns for intra-abdominal process versus urinary tract infection due to nausea, vomiting, and suprapubic abdominal pain.  Stable laboratory studies.  UA demonstrates urinary tract infection.  Patient already given Rocephin.  Recommending admission at this time for altered mental status secondary to sepsis from urinary tract infection.  Patient agreeable to plan.  I spoke with admitting physician Dr. Julian Reil agrees to accept patient.        Final Clinical Impression(s) / ED Diagnoses Final diagnoses:  Sepsis, due to unspecified organism,  unspecified whether acute organ dysfunction present (HCC)  Altered mental status, unspecified altered mental status type  Urinary tract infection with hematuria, site unspecified    Rx / DC Orders ED Discharge Orders     None         Franne Forts, DO 12/08/22 2210

## 2022-12-08 NOTE — ED Notes (Signed)
In and out cath done. Pt covered in feces into folds of vagina. Brief changed and patient cleaned as well as could be. She is not able to roll well and briefs we have are not large enough

## 2022-12-08 NOTE — Sepsis Progress Note (Signed)
Sepsis protocol is being followed by [facility]. 

## 2022-12-08 NOTE — ED Notes (Signed)
Iv team at bedside  

## 2022-12-09 ENCOUNTER — Encounter (HOSPITAL_COMMUNITY): Payer: Self-pay | Admitting: Internal Medicine

## 2022-12-09 DIAGNOSIS — A419 Sepsis, unspecified organism: Secondary | ICD-10-CM | POA: Diagnosis present

## 2022-12-09 DIAGNOSIS — L89302 Pressure ulcer of unspecified buttock, stage 2: Secondary | ICD-10-CM | POA: Diagnosis present

## 2022-12-09 DIAGNOSIS — R6520 Severe sepsis without septic shock: Secondary | ICD-10-CM

## 2022-12-09 DIAGNOSIS — N390 Urinary tract infection, site not specified: Secondary | ICD-10-CM

## 2022-12-09 DIAGNOSIS — I10 Essential (primary) hypertension: Secondary | ICD-10-CM

## 2022-12-09 DIAGNOSIS — Z7982 Long term (current) use of aspirin: Secondary | ICD-10-CM | POA: Diagnosis not present

## 2022-12-09 DIAGNOSIS — Z7985 Long-term (current) use of injectable non-insulin antidiabetic drugs: Secondary | ICD-10-CM | POA: Diagnosis not present

## 2022-12-09 DIAGNOSIS — J449 Chronic obstructive pulmonary disease, unspecified: Secondary | ICD-10-CM | POA: Diagnosis present

## 2022-12-09 DIAGNOSIS — M069 Rheumatoid arthritis, unspecified: Secondary | ICD-10-CM | POA: Diagnosis present

## 2022-12-09 DIAGNOSIS — L89312 Pressure ulcer of right buttock, stage 2: Secondary | ICD-10-CM

## 2022-12-09 DIAGNOSIS — Z1152 Encounter for screening for COVID-19: Secondary | ICD-10-CM | POA: Diagnosis not present

## 2022-12-09 DIAGNOSIS — F05 Delirium due to known physiological condition: Secondary | ICD-10-CM | POA: Diagnosis present

## 2022-12-09 DIAGNOSIS — G9340 Encephalopathy, unspecified: Secondary | ICD-10-CM | POA: Diagnosis not present

## 2022-12-09 DIAGNOSIS — Z1629 Resistance to other single specified antibiotic: Secondary | ICD-10-CM | POA: Diagnosis present

## 2022-12-09 DIAGNOSIS — Z1611 Resistance to penicillins: Secondary | ICD-10-CM | POA: Diagnosis present

## 2022-12-09 DIAGNOSIS — Z1623 Resistance to quinolones and fluoroquinolones: Secondary | ICD-10-CM | POA: Diagnosis present

## 2022-12-09 DIAGNOSIS — E119 Type 2 diabetes mellitus without complications: Secondary | ICD-10-CM | POA: Diagnosis present

## 2022-12-09 DIAGNOSIS — A4151 Sepsis due to Escherichia coli [E. coli]: Secondary | ICD-10-CM | POA: Diagnosis present

## 2022-12-09 DIAGNOSIS — Z6841 Body Mass Index (BMI) 40.0 and over, adult: Secondary | ICD-10-CM

## 2022-12-09 DIAGNOSIS — G9341 Metabolic encephalopathy: Secondary | ICD-10-CM

## 2022-12-09 DIAGNOSIS — Z7951 Long term (current) use of inhaled steroids: Secondary | ICD-10-CM | POA: Diagnosis not present

## 2022-12-09 DIAGNOSIS — G3184 Mild cognitive impairment, so stated: Secondary | ICD-10-CM | POA: Diagnosis present

## 2022-12-09 DIAGNOSIS — R4182 Altered mental status, unspecified: Secondary | ICD-10-CM | POA: Diagnosis present

## 2022-12-09 DIAGNOSIS — L89322 Pressure ulcer of left buttock, stage 2: Secondary | ICD-10-CM

## 2022-12-09 DIAGNOSIS — Z79899 Other long term (current) drug therapy: Secondary | ICD-10-CM | POA: Diagnosis not present

## 2022-12-09 LAB — BLOOD CULTURE ID PANEL (REFLEXED) - BCID2

## 2022-12-09 LAB — CBC
HCT: 34.4 % — ABNORMAL LOW (ref 36.0–46.0)
Hemoglobin: 10.3 g/dL — ABNORMAL LOW (ref 12.0–15.0)
MCH: 28 pg (ref 26.0–34.0)
MCHC: 29.9 g/dL — ABNORMAL LOW (ref 30.0–36.0)
MCV: 93.5 fL (ref 80.0–100.0)
Platelets: 212 10*3/uL (ref 150–400)
RBC: 3.68 MIL/uL — ABNORMAL LOW (ref 3.87–5.11)
RDW: 15.4 % (ref 11.5–15.5)
WBC: 7.6 10*3/uL (ref 4.0–10.5)
nRBC: 0 % (ref 0.0–0.2)

## 2022-12-09 LAB — I-STAT ARTERIAL BLOOD GAS, ED
Acid-Base Excess: 0 mmol/L (ref 0.0–2.0)
Bicarbonate: 25.9 mmol/L (ref 20.0–28.0)
Calcium, Ion: 1.32 mmol/L (ref 1.15–1.40)
HCT: 34 % — ABNORMAL LOW (ref 36.0–46.0)
Hemoglobin: 11.6 g/dL — ABNORMAL LOW (ref 12.0–15.0)
O2 Saturation: 99 %
Patient temperature: 98
Potassium: 4 mmol/L (ref 3.5–5.1)
Sodium: 139 mmol/L (ref 135–145)
TCO2: 27 mmol/L (ref 22–32)
pCO2 arterial: 45.2 mmHg (ref 32–48)
pH, Arterial: 7.365 (ref 7.35–7.45)
pO2, Arterial: 134 mmHg — ABNORMAL HIGH (ref 83–108)

## 2022-12-09 LAB — COMPREHENSIVE METABOLIC PANEL
ALT: 21 U/L (ref 0–44)
AST: 21 U/L (ref 15–41)
Albumin: 2.6 g/dL — ABNORMAL LOW (ref 3.5–5.0)
Alkaline Phosphatase: 83 U/L (ref 38–126)
Anion gap: 11 (ref 5–15)
BUN: 21 mg/dL (ref 8–23)
CO2: 22 mmol/L (ref 22–32)
Calcium: 9.3 mg/dL (ref 8.9–10.3)
Chloride: 104 mmol/L (ref 98–111)
Creatinine, Ser: 1.26 mg/dL — ABNORMAL HIGH (ref 0.44–1.00)
GFR, Estimated: 46 mL/min — ABNORMAL LOW (ref >60)
Glucose, Bld: 143 mg/dL — ABNORMAL HIGH (ref 70–99)
Potassium: 4.2 mmol/L (ref 3.5–5.1)
Sodium: 137 mmol/L (ref 135–145)
Total Bilirubin: 0.7 mg/dL (ref 0.3–1.2)
Total Protein: 7.6 g/dL (ref 6.5–8.1)

## 2022-12-09 LAB — HEMOGLOBIN A1C
Hgb A1c MFr Bld: 6.6 % — ABNORMAL HIGH (ref 4.8–5.6)
Mean Plasma Glucose: 142.72 mg/dL

## 2022-12-09 LAB — CBG MONITORING, ED
Glucose-Capillary: 116 mg/dL — ABNORMAL HIGH (ref 70–99)
Glucose-Capillary: 113 mg/dL — ABNORMAL HIGH (ref 70–99)
Glucose-Capillary: 113 mg/dL — ABNORMAL HIGH (ref 70–99)
Glucose-Capillary: 131 mg/dL — ABNORMAL HIGH (ref 70–99)
Glucose-Capillary: 143 mg/dL — ABNORMAL HIGH (ref 70–99)
Glucose-Capillary: 130 mg/dL — ABNORMAL HIGH (ref 70–99)

## 2022-12-09 LAB — GLUCOSE, CAPILLARY: Glucose-Capillary: 113 mg/dL — ABNORMAL HIGH (ref 70–99)

## 2022-12-09 LAB — LACTIC ACID, PLASMA
Lactic Acid, Venous: 1 mmol/L (ref 0.5–1.9)
Lactic Acid, Venous: 1.4 mmol/L (ref 0.5–1.9)

## 2022-12-09 LAB — CULTURE, BLOOD (ROUTINE X 2)
Special Requests: ADEQUATE
Special Requests: ADEQUATE

## 2022-12-09 MED ORDER — SODIUM CHLORIDE 0.9 % IV SOLN
1.0000 g | INTRAVENOUS | Status: DC
  Administered 2022-12-09 – 2022-12-10 (×2): 1 g via INTRAVENOUS
  Filled 2022-12-09 (×2): qty 10

## 2022-12-09 MED ORDER — METHOTREXATE SODIUM 2.5 MG PO TABS
5.0000 mg | ORAL_TABLET | ORAL | Status: DC
  Administered 2022-12-09: 5 mg via ORAL
  Filled 2022-12-09 (×2): qty 2

## 2022-12-09 MED ORDER — TOPIRAMATE 25 MG PO TABS
25.0000 mg | ORAL_TABLET | Freq: Two times a day (BID) | ORAL | Status: DC
  Administered 2022-12-09 – 2022-12-15 (×12): 25 mg via ORAL
  Filled 2022-12-09 (×12): qty 1

## 2022-12-09 MED ORDER — ASPIRIN 81 MG PO CHEW
81.0000 mg | CHEWABLE_TABLET | Freq: Every day | ORAL | Status: DC
  Administered 2022-12-09 – 2022-12-15 (×7): 81 mg via ORAL
  Filled 2022-12-09 (×7): qty 1

## 2022-12-09 MED ORDER — ATORVASTATIN CALCIUM 10 MG PO TABS
10.0000 mg | ORAL_TABLET | Freq: Every day | ORAL | Status: DC
  Administered 2022-12-09 – 2022-12-14 (×6): 10 mg via ORAL
  Filled 2022-12-09 (×6): qty 1

## 2022-12-09 MED ORDER — DICLOFENAC SODIUM 1 % EX GEL
4.0000 g | Freq: Every day | CUTANEOUS | Status: DC
  Administered 2022-12-09 – 2022-12-15 (×7): 4 g via TOPICAL
  Filled 2022-12-09 (×2): qty 100

## 2022-12-09 MED ORDER — GABAPENTIN 400 MG PO CAPS
800.0000 mg | ORAL_CAPSULE | Freq: Two times a day (BID) | ORAL | Status: DC
  Administered 2022-12-09 – 2022-12-10 (×2): 800 mg via ORAL
  Filled 2022-12-09 (×2): qty 2

## 2022-12-09 MED ORDER — ONDANSETRON HCL 4 MG PO TABS: 4.0000 mg | ORAL_TABLET | Freq: Four times a day (QID) | ORAL | Status: DC | PRN

## 2022-12-09 MED ORDER — VITAMIN D 25 MCG (1000 UNIT) PO TABS
2000.0000 [IU] | ORAL_TABLET | Freq: Every day | ORAL | Status: DC
  Administered 2022-12-09 – 2022-12-15 (×7): 2000 [IU] via ORAL
  Filled 2022-12-09 (×7): qty 2

## 2022-12-09 MED ORDER — FAMOTIDINE 20 MG PO TABS
20.0000 mg | ORAL_TABLET | Freq: Every day | ORAL | Status: DC
  Administered 2022-12-09 – 2022-12-15 (×7): 20 mg via ORAL
  Filled 2022-12-09 (×7): qty 1

## 2022-12-09 MED ORDER — INSULIN ASPART 100 UNIT/ML IJ SOLN
0.0000 [IU] | INTRAMUSCULAR | Status: DC
  Administered 2022-12-09 – 2022-12-11 (×10): 1 [IU] via SUBCUTANEOUS
  Administered 2022-12-12: 2 [IU] via SUBCUTANEOUS
  Administered 2022-12-12 – 2022-12-15 (×7): 1 [IU] via SUBCUTANEOUS
  Administered 2022-12-15: 2 [IU] via SUBCUTANEOUS
  Administered 2022-12-15: 1 [IU] via SUBCUTANEOUS

## 2022-12-09 MED ORDER — ENOXAPARIN SODIUM 40 MG/0.4ML IJ SOSY
40.0000 mg | PREFILLED_SYRINGE | INTRAMUSCULAR | Status: DC
  Administered 2022-12-09: 40 mg via SUBCUTANEOUS
  Filled 2022-12-09: qty 0.4

## 2022-12-09 MED ORDER — BUDESONIDE 0.5 MG/2ML IN SUSP
0.5000 mg | Freq: Two times a day (BID) | RESPIRATORY_TRACT | Status: DC
  Administered 2022-12-10 – 2022-12-15 (×9): 0.5 mg via RESPIRATORY_TRACT
  Filled 2022-12-09 (×11): qty 2

## 2022-12-09 MED ORDER — OXYCODONE HCL 5 MG PO TABS
5.0000 mg | ORAL_TABLET | Freq: Two times a day (BID) | ORAL | Status: DC
  Administered 2022-12-09 – 2022-12-15 (×12): 5 mg via ORAL
  Filled 2022-12-09 (×12): qty 1

## 2022-12-09 MED ORDER — SENNOSIDES-DOCUSATE SODIUM 8.6-50 MG PO TABS
2.0000 | ORAL_TABLET | Freq: Every day | ORAL | Status: DC
  Administered 2022-12-09 – 2022-12-15 (×7): 2 via ORAL
  Filled 2022-12-09 (×6): qty 2

## 2022-12-09 MED ORDER — CALCIUM CARBONATE ANTACID 500 MG PO CHEW
2.0000 | CHEWABLE_TABLET | Freq: Every day | ORAL | Status: DC
  Administered 2022-12-09 – 2022-12-15 (×7): 400 mg via ORAL
  Filled 2022-12-09 (×7): qty 2

## 2022-12-09 MED ORDER — POLYETHYLENE GLYCOL 3350 17 G PO PACK
17.0000 g | PACK | Freq: Every day | ORAL | Status: DC
  Administered 2022-12-10 – 2022-12-15 (×6): 17 g via ORAL
  Filled 2022-12-09 (×5): qty 1

## 2022-12-09 MED ORDER — FOLIC ACID 1 MG PO TABS
1.0000 mg | ORAL_TABLET | Freq: Every day | ORAL | Status: DC
  Administered 2022-12-09 – 2022-12-15 (×7): 1 mg via ORAL
  Filled 2022-12-09 (×7): qty 1

## 2022-12-09 MED ORDER — ACETAMINOPHEN 650 MG RE SUPP: 650.0000 mg | Freq: Four times a day (QID) | RECTAL | Status: DC | PRN

## 2022-12-09 MED ORDER — ASPIRIN-ACETAMINOPHEN-CAFFEINE 250-250-65 MG PO TABS
1.0000 | ORAL_TABLET | Freq: Every day | ORAL | Status: DC | PRN
  Administered 2022-12-15: 1 via ORAL
  Filled 2022-12-09 (×2): qty 1

## 2022-12-09 MED ORDER — MONTELUKAST SODIUM 10 MG PO TABS
10.0000 mg | ORAL_TABLET | Freq: Every day | ORAL | Status: DC
  Administered 2022-12-09 – 2022-12-15 (×7): 10 mg via ORAL
  Filled 2022-12-09 (×7): qty 1

## 2022-12-09 MED ORDER — LACTATED RINGERS IV BOLUS
1000.0000 mL | Freq: Once | INTRAVENOUS | Status: AC
  Administered 2022-12-09: 1000 mL via INTRAVENOUS

## 2022-12-09 MED ORDER — ENOXAPARIN SODIUM 80 MG/0.8ML IJ SOSY
80.0000 mg | PREFILLED_SYRINGE | INTRAMUSCULAR | Status: DC
  Administered 2022-12-10 – 2022-12-15 (×6): 80 mg via SUBCUTANEOUS
  Filled 2022-12-09 (×6): qty 0.8

## 2022-12-09 MED ORDER — ACETAMINOPHEN 325 MG PO TABS
650.0000 mg | ORAL_TABLET | Freq: Four times a day (QID) | ORAL | Status: DC | PRN
  Administered 2022-12-13 – 2022-12-15 (×3): 650 mg via ORAL
  Filled 2022-12-09 (×3): qty 2

## 2022-12-09 MED ORDER — FERROUS SULFATE 325 (65 FE) MG PO TABS
325.0000 mg | ORAL_TABLET | Freq: Every day | ORAL | Status: DC
  Administered 2022-12-10 – 2022-12-15 (×6): 325 mg via ORAL
  Filled 2022-12-09 (×6): qty 1

## 2022-12-09 MED ORDER — DULOXETINE HCL 60 MG PO CPEP
60.0000 mg | ORAL_CAPSULE | Freq: Every day | ORAL | Status: DC
  Administered 2022-12-09 – 2022-12-15 (×7): 60 mg via ORAL
  Filled 2022-12-09 (×7): qty 1

## 2022-12-09 MED ORDER — IPRATROPIUM BROMIDE 0.02 % IN SOLN
0.5000 mg | Freq: Four times a day (QID) | RESPIRATORY_TRACT | Status: DC | PRN
  Administered 2022-12-10: 0.5 mg via RESPIRATORY_TRACT
  Filled 2022-12-09: qty 2.5

## 2022-12-09 MED ORDER — FLUTICASONE PROPIONATE 50 MCG/ACT NA SUSP
1.0000 | Freq: Every day | NASAL | Status: DC
  Administered 2022-12-10 – 2022-12-15 (×6): 1 via NASAL
  Filled 2022-12-09 (×2): qty 16

## 2022-12-09 MED ORDER — ONDANSETRON HCL 4 MG/2ML IJ SOLN: 4.0000 mg | Freq: Four times a day (QID) | INTRAMUSCULAR | Status: DC | PRN

## 2022-12-09 NOTE — H&P (Addendum)
History and Physical    Patient: Michelle Gay QQV:[id number] DOB: Sep 18, 1950 DOA: 12/08/2022 DOS: the patient was seen and examined on 12/09/2022 PCP: Justin Mend, MD  Patient coming from: SNF  Chief Complaint:  Chief Complaint  Patient presents with   Altered Mental Status   HPI: Michelle Gay is a 72 y.o. female with medical history significant of COPD, DM2, HTN, MCI, morbid obesity.  Pt in to ED from facility with AMS, N/V and lower abd pain over past few days.  Increased urinary frequency.  Pt satting 97% on RA when we can get a wave form.  Denies fever, chills, cough.  Pt intermittently answering questions and intermittently non-verbal.  Of note, pt with very similar presentation with AMS due to UTI back in May.   Review of Systems: As mentioned in the history of present illness. All other systems reviewed and are negative. Past Medical History:  Diagnosis Date   COPD (chronic obstructive pulmonary disease) (HCC)    DM2 (diabetes mellitus, type 2) (HCC)    HTN (hypertension)    MCI (mild cognitive impairment)    Morbid obesity with BMI of 60.0-69.9, adult (HCC)    History reviewed. No pertinent surgical history. Social History:  reports that she has an unknown smoking status. She has never used smokeless tobacco. She reports that she does not currently use alcohol. She reports that she does not currently use drugs.  No Known Allergies  Family History  Family history unknown: Yes    Prior to Admission medications   Medication Sig Start Date End Date Taking? Authorizing Provider  acetaminophen (TYLENOL) 500 MG tablet Take 1,000 mg by mouth in the morning and at bedtime.   Yes [provider]  albuterol (VENTOLIN HFA) 108 (90 Base) MCG/ACT inhaler Inhale 2 puffs into the lungs every 6 (six) hours as needed for shortness of breath. 09/01/22  Yes [provider]  aspirin 81 MG chewable tablet Chew 81 mg by mouth daily.   Yes [provider]  aspirin-acetaminophen-caffeine (EXCEDRIN MIGRAINE) [phone] MG tablet Take 1 tablet by mouth daily as needed for headache or migraine.   Yes [provider]  atorvastatin (LIPITOR) 10 MG tablet Take 10 mg by mouth at bedtime. 08/10/22  Yes [provider]  Benzocaine-Menthol (CEPACOL SORE THROAT) 10-2.1 MG LOZG Use as directed 1 lozenge in the mouth or throat daily.   Yes [provider]  budesonide (PULMICORT) 0.5 MG/2ML nebulizer solution Take 0.5 mg by nebulization 2 times daily at 12 noon and 4 pm. 09/09/22  Yes [provider]  calcium carbonate (TUMS - DOSED IN MG ELEMENTAL CALCIUM) 500 MG chewable tablet Chew 2 tablets by mouth daily.   Yes [provider]  Cholecalciferol 50 MCG (2000 UT) TABS Take 2,000 Units by mouth daily.   Yes [provider]  cloNIDine (CATAPRES) 0.1 MG tablet Take 0.1 mg by mouth every 12 (twelve) hours as needed (hypertension). If SBP > 160 or DBP >100. If BP >160/100 1 hour after administration call MD.   Yes [provider]  cycloSPORINE (RESTASIS) 0.05 % ophthalmic emulsion Place 2 drops into both eyes 2 (two) times daily. 07/26/22  Yes [provider]  diclofenac Sodium (VOLTAREN) 1 % GEL Apply 4 g topically daily. To bilateral hands 07/04/22  Yes [provider]  DULoxetine (CYMBALTA) 60 MG capsule Take 60 mg by mouth daily. 08/10/22  Yes [provider]  famotidine (PEPCID) 20 MG tablet Take 20 mg by  mouth daily. 08/10/22  Yes [provider]  ferrous sulfate 325 (65 FE) MG tablet Take 325 mg by mouth daily with breakfast.   Yes [provider]  fluticasone (FLONASE) 50 MCG/ACT nasal spray Place 1 spray into both nostrils daily.   Yes [provider]  folic acid (FOLVITE) 1 MG tablet Take 1 mg by mouth daily.   Yes [provider]  gabapentin (NEURONTIN) 800 MG tablet Take 800 mg by mouth 2 (two) times daily. 08/10/22  Yes  [provider]  ipratropium (ATROVENT) 0.02 % nebulizer solution Take 0.5 mg by nebulization in the morning, at noon, and at bedtime. 09/03/22  Yes [provider]  lidocaine (HM LIDOCAINE PATCH) 4 % Place 0.5 patches onto the skin daily.   Yes [provider]  Menthol, Topical Analgesic, (BIOFREEZE PROFESSIONAL) 5 % GEL Apply 1 Application topically in the morning and at bedtime. To left arm & shoulder   Yes [provider]  methotrexate (RHEUMATREX) 2.5 MG tablet Take 5 mg by mouth once a week. Caution:Chemotherapy. Protect from light. Tuesdays   Yes [provider]  montelukast (SINGULAIR) 10 MG tablet Take 10 mg by mouth daily. 08/10/22  Yes [provider]  oxyCODONE (OXY IR/ROXICODONE) 5 MG immediate release tablet Take 5 mg by mouth 2 (two) times daily. 08/28/22  Yes [provider]  OZEMPIC, 0.25 OR 0.5 MG/DOSE, 2 MG/3ML SOPN Inject 0.5 mg into the skin once a week. Tuesdays 08/13/22  Yes [provider]  polyethylene glycol (MIRALAX / GLYCOLAX) 17 g packet Take 17 g by mouth daily.   Yes [provider]  sennosides-docusate sodium (SENOKOT-S) 8.6-50 MG tablet Take 2 tablets by mouth daily.   Yes [provider]  tiZANidine (ZANAFLEX) 4 MG tablet Take 2 mg by mouth in the morning and at bedtime. 08/10/22  Yes [provider]  topiramate (TOPAMAX) 25 MG tablet Take 25 mg by mouth 2 (two) times daily. 08/10/22  Yes [provider]  valsartan (DIOVAN) 80 MG tablet Take 80 mg by mouth daily.   Yes [provider]  Zinc Oxide (BAZA PROTECT MOISTURE BARRIER EX) Apply 1 Application topically in the morning and at bedtime.   Yes [provider]  feeding supplement (ENSURE ENLIVE / ENSURE PLUS) LIQD Take 237 mLs by mouth 3 (three) times daily between meals. Patient not taking: Reported on 12/08/2022 09/20/22   Champ Mungo, DO    Physical Exam: Vitals:   12/08/22 2200 12/08/22 2226  12/09/22 0025 12/09/22 0031  BP: 103/76 103/76    Pulse:  78 66   Resp: 18 18 (!) 28   Temp:    98 F (36.7 C)  TempSrc:    Oral  SpO2:  95% 100%   Weight:      Height:       Constitutional: Ill appearing Respiratory: Some increased WOB / RR, but lungs clear bilaterally  Cardiovascular: Regular rate and rhythm, no murmurs / rubs / gallops. No extremity edema. 2+ pedal pulses. No carotid bruits.  Abdomen: no tenderness, no masses palpated. No hepatosplenomegaly. Bowel sounds positive.  Skin: Bilateral ~1cm stage 2 decubitus ulcers of buttock.  No surrounding erythema, exudate, crepitus, etc.  No foot ulcers. Neurologic: MAE, sensation in tact, pt has bilateral foot drop present, this appears to be chronic as it was documented to be present by admitting resident on H+P in May. Psychiatric: Answering questions occasionally, non verbal at other times.  Data Reviewed:  Labs on Admission: I have personally reviewed following labs and imaging studies  CBC: Recent Labs  Lab 12/08/22 1702 12/08/22 1756 12/09/22 0047  WBC 7.4  --   --   NEUTROABS 4.5  --   --   HGB 10.7* 12.6 11.6*  HCT 37.0 37.0 34.0*  MCV 95.4  --   --   PLT 231  --   --    Basic Metabolic Panel: Recent Labs  Lab 12/08/22 1702 12/08/22 1756 12/09/22 0047  NA 139 140 139  K 4.2 4.3 4.0  CL 98  --   --   CO2 22  --   --   GLUCOSE 160*  --   --   BUN 21  --   --   CREATININE 1.25*  --   --   CALCIUM 10.0  --   --    GFR: Estimated Creatinine Clearance: 63.7 mL/min (A) (by C-G formula based on SCr of 1.25 mg/dL (H)). Liver Function Tests: Recent Labs  Lab 12/08/22 1702  AST 27  ALT 27  ALKPHOS 96  BILITOT 0.5  PROT 8.1  ALBUMIN 2.9*   Recent Labs  Lab 12/08/22 1702  LIPASE 35   No results for input(s): "AMMONIA" in the last 168 hours. Coagulation Profile: Recent Labs  Lab 12/08/22 1702  INR 1.2   Cardiac Enzymes: No results for input(s): "CKTOTAL", "CKMB", "CKMBINDEX", "TROPONINI"  in the last 168 hours. BNP (last 3 results) No results for input(s): "PROBNP" in the last 8760 hours. HbA1C: No results for input(s): "HGBA1C" in the last 72 hours. CBG: Recent Labs  Lab 12/09/22 0115  GLUCAP 116*   Lipid Profile: No results for input(s): "CHOL", "HDL", "LDLCALC", "TRIG", "CHOLHDL", "LDLDIRECT" in the last 72 hours. Thyroid Function Tests: No results for input(s): "TSH", "T4TOTAL", "FREET4", "T3FREE", "THYROIDAB" in the last 72 hours. Anemia Panel: No results for input(s): "VITAMINB12", "FOLATE", "FERRITIN", "TIBC", "IRON", "RETICCTPCT" in the last 72 hours. Urine analysis:    Component Value Date/Time   COLORURINE YELLOW 12/08/2022 1858   APPEARANCEUR TURBID (A) 12/08/2022 1858   LABSPEC 1.028 12/08/2022 1858   PHURINE 5.0 12/08/2022 1858   GLUCOSEU NEGATIVE 12/08/2022 1858   HGBUR LARGE (A) 12/08/2022 1858   BILIRUBINUR NEGATIVE 12/08/2022 1858   KETONESUR NEGATIVE 12/08/2022 1858   PROTEINUR 100 (A) 12/08/2022 1858   NITRITE NEGATIVE 12/08/2022 1858   LEUKOCYTESUR MODERATE (A) 12/08/2022 1858    Radiological Exams on Admission: CT Head Wo Contrast  Result Date: 12/08/2022 CLINICAL DATA:  Altered mental status, nontraumatic (Ped 0-17y) EXAM: CT HEAD WITHOUT CONTRAST TECHNIQUE: Contiguous axial images were obtained from the base of the skull through the vertex without intravenous contrast. RADIATION DOSE REDUCTION: This exam was performed according to the departmental dose-optimization program which includes automated exposure control, adjustment of the mA and/or kV according to patient size and/or use of iterative reconstruction technique. COMPARISON:  09/17/2022 FINDINGS: Brain: No acute intracranial abnormality. Specifically, no hemorrhage, hydrocephalus, mass lesion, acute infarction, or significant intracranial injury. Vascular: No hyperdense vessel or unexpected calcification. Skull: No acute calvarial abnormality. Sinuses/Orbits: No acute findings Other:  None IMPRESSION: No acute intracranial abnormality. Electronically Signed   By: Charlett Nose M.D.   On: 12/08/2022 20:35   DG Chest Portable 1 View  Result Date: 12/08/2022 CLINICAL DATA:  Shortness of breath. EXAM: PORTABLE CHEST 1 VIEW COMPARISON:  Sep 17, 2022. FINDINGS: Stable cardiomegaly. No acute pulmonary disease is noted. Bony thorax is unremarkable. IMPRESSION: No active disease. Electronically Signed  By: Lupita Raider M.D.   On: 12/08/2022 18:12    EKG: Independently reviewed.   Assessment and Plan: * Sepsis with acute organ dysfunction [facility]) Patient meets criteria for sepsis at time of admission. Specifically the patient has at least 2 out of 4 SIRS criteria, namely: Tachycardia, tachypnea The currently suspected source of infection is UTI Lactic acid: 1.7 Blood pressure: 103/76 currently IVF: LR at 150 cc/hr, not bolused due to no hypotension and nl lactates Antibiotics: Rocephin and flagyl initially, now just rocephin Cultures pending H/o sepsis due to UTI with very similar presentation x2 months ago Mostly sensitive e.coli and Klebsellia at that time. UCx and BCx pending Tele monitor Cont empiric rocephin  Decubitus ulcer of buttock, stage 2 (HCC) Bilateral stage 2 decubitus ulcers of buttocks, ~1cm diameter in each case.  No surrounding erythema, exudate, crepitus, etc to suggest active infection of these ulcers. Wound care consult.  Acute encephalopathy Likely delirium secondary to UTI with sepsis.  H/o same when she was admitted back in May it looks like. Treat UTI and sepsis as above NPO for the moment until mental status improves some.  COPD (chronic obstructive pulmonary disease) (HCC) Mildly increased WOB Pt denies using CPAP/BIPAP at baseline Pt denies difficulty with breathing or respiratory symptoms at this time. Having difficulty getting good waveform for O2 sat on monitor though.  When it has worked she has been in upper 90s however. VBG resulted  from earlier, though I suspect this may actually be an ABG given: 1) drawn from R hand and 2) O2 sat of 99% Cont home nebs   Morbid obesity with BMI of 60.0-69.9, adult (HCC) Will hold ozempic for the moment.  HTN (hypertension) Hold home BP meds given initial soft BPs with sepsis.  DM2 (diabetes mellitus, type 2) (HCC) Hold Ozempic SSI sensitive Q4H for the moment      Advance Care Planning:   Code Status: Full Code Default status due to AMS  Consults: None  Family Communication: No family in room  Severity of Illness: The appropriate patient status for this patient is OBSERVATION. Observation status is judged to be reasonable and necessary in order to provide the required intensity of service to ensure the patient's safety. The patient's presenting symptoms, physical exam findings, and initial radiographic and laboratory data in the context of their medical condition is felt to place them at decreased risk for further clinical deterioration. Furthermore, it is anticipated that the patient will be medically stable for discharge from the hospital within 2 midnights of admission.   Author: Hillary Bow., DO 12/09/2022 12:38 AM  For on call review [email or web address].

## 2022-12-09 NOTE — Assessment & Plan Note (Addendum)
Will hold ozempic for the moment.

## 2022-12-09 NOTE — Progress Notes (Signed)
PHARMACY - PHYSICIAN COMMUNICATION CRITICAL VALUE ALERT - BLOOD CULTURE IDENTIFICATION (BCID)  Michelle Gay is an 72 y.o. female who presented to [facility] on 12/08/2022 with a chief complaint of AMS, N/V, abd discomfort, urinary frequency.  Assessment:  1 of 4 blood cultures growing Staph species, high likelihood of contamination  Name of physician (or Provider) Contacted: Dr. Imogene Burn  Current antibiotics: ceftriaxone, metronidazole  Changes to prescribed antibiotics recommended:  Recommendations accepted by provider - no additional treatment at this time.  Results for orders placed or performed during the hospital encounter of 09/17/22  Blood Culture ID Panel (Reflexed) (Collected: 09/17/2022 12:03 PM)  Result Value Ref Range   Enterococcus faecalis NOT DETECTED NOT DETECTED   Enterococcus Faecium NOT DETECTED NOT DETECTED   Listeria monocytogenes NOT DETECTED NOT DETECTED   Staphylococcus species DETECTED (A) NOT DETECTED   Staphylococcus aureus (BCID) NOT DETECTED NOT DETECTED   Staphylococcus epidermidis NOT DETECTED NOT DETECTED   Staphylococcus lugdunensis NOT DETECTED NOT DETECTED   Streptococcus species NOT DETECTED NOT DETECTED   Streptococcus agalactiae NOT DETECTED NOT DETECTED   Streptococcus pneumoniae NOT DETECTED NOT DETECTED   Streptococcus pyogenes NOT DETECTED NOT DETECTED   A.calcoaceticus-baumannii NOT DETECTED NOT DETECTED   Bacteroides fragilis NOT DETECTED NOT DETECTED   Enterobacterales NOT DETECTED NOT DETECTED   Enterobacter cloacae complex NOT DETECTED NOT DETECTED   Escherichia coli NOT DETECTED NOT DETECTED   Klebsiella aerogenes NOT DETECTED NOT DETECTED   Klebsiella oxytoca NOT DETECTED NOT DETECTED   Klebsiella pneumoniae NOT DETECTED NOT DETECTED   Proteus species NOT DETECTED NOT DETECTED   Salmonella species NOT DETECTED NOT DETECTED   Serratia marcescens NOT DETECTED NOT DETECTED   Haemophilus influenzae NOT DETECTED NOT DETECTED   Neisseria  meningitidis NOT DETECTED NOT DETECTED   Pseudomonas aeruginosa NOT DETECTED NOT DETECTED   Stenotrophomonas maltophilia NOT DETECTED NOT DETECTED   Candida albicans NOT DETECTED NOT DETECTED   Candida auris NOT DETECTED NOT DETECTED   Candida glabrata NOT DETECTED NOT DETECTED   Candida krusei NOT DETECTED NOT DETECTED   Candida parapsilosis NOT DETECTED NOT DETECTED   Candida tropicalis NOT DETECTED NOT DETECTED   Cryptococcus neoformans/gattii NOT DETECTED NOT DETECTED    Loralee Pacas, PharmD, BCPS 12/09/2022  7:52 PM

## 2022-12-09 NOTE — ED Notes (Addendum)
CBG: 113 °

## 2022-12-09 NOTE — Progress Notes (Signed)
Patient was seen and examined.  She was still in the emergency room.  Poor historian.  Called and discussed with patient's niece over the phone.  Admitted early morning hours by nighttime hospitalist.  See H&P, assessment plan as done by Dr. Julian Reil with following addition.  In brief, 72 year old chronically sick lady from nursing home with COPD, chronic hypoxemia on 2 L of oxygen, type 2 diabetes on insulin, hypertension, morbid obesity, severe generalized weakness brought to the emergency room from facility with altered mental status, nausea vomiting and lower abdominal discomfort as well as increased urinary frequency.  Similar admission in the past after suffering from UTI.  Urine was grossly abnormal.  Patient was resuscitated, started on IV antibiotics with Rocephin and Flagyl and admitted to the hospital.  Currently hemodynamically stable.  She was able to swallow safely, will start on diet.  Continue maintenance IV fluids.  Low-dose insulin while she is not having reliable oral intake.  Anticipate hospital stay more than 2 midnights.  Next of kin called and updated.  Total time spent: 30 minutes.  Same-day admit.  No charge visit

## 2022-12-09 NOTE — Assessment & Plan Note (Signed)
Hold home BP meds given initial soft BPs with sepsis.

## 2022-12-09 NOTE — Plan of Care (Signed)
  Problem: Fluid Volume: Goal: Ability to maintain a balanced intake and output will improve Outcome: Progressing   Problem: Metabolic: Goal: Ability to maintain appropriate glucose levels will improve Outcome: Progressing   Problem: Nutritional: Goal: Maintenance of adequate nutrition will improve Outcome: Progressing   Problem: Skin Integrity: Goal: Risk for impaired skin integrity will decrease Outcome: Progressing   Problem: Tissue Perfusion: Goal: Adequacy of tissue perfusion will improve Outcome: Progressing   Problem: Clinical Measurements: Goal: Ability to maintain clinical measurements within normal limits will improve Outcome: Progressing Goal: Will remain free from infection Outcome: Progressing Goal: Diagnostic test results will improve Outcome: Progressing Goal: Respiratory complications will improve Outcome: Progressing   Problem: Education: Goal: Ability to describe self-care measures that may prevent or decrease complications (Diabetes Survival Skills Education) will improve Outcome: Not Progressing Goal: Individualized Educational Video(s) Outcome: Not Progressing   Problem: Coping: Goal: Ability to adjust to condition or change in health will improve Outcome: Not Progressing   Problem: Health Behavior/Discharge Planning: Goal: Ability to identify and utilize available resources and services will improve Outcome: Not Progressing   Problem: Education: Goal: Knowledge of General Education information will improve Description: Including pain rating scale, medication(s)/side effects and non-pharmacologic comfort measures Outcome: Not Progressing

## 2022-12-09 NOTE — Assessment & Plan Note (Signed)
Bilateral stage 2 decubitus ulcers of buttocks, ~1cm diameter in each case.  No surrounding erythema, exudate, crepitus, etc to suggest active infection of these ulcers. Wound care consult.

## 2022-12-09 NOTE — Assessment & Plan Note (Signed)
Likely delirium secondary to UTI with sepsis.  H/o same when she was admitted back in May it looks like. Treat UTI and sepsis as above NPO for the moment until mental status improves some.

## 2022-12-09 NOTE — Assessment & Plan Note (Addendum)
Mildly increased WOB Pt denies using CPAP/BIPAP at baseline Pt denies difficulty with breathing or respiratory symptoms at this time. Having difficulty getting good waveform for O2 sat on monitor though.  When it has worked she has been in upper 90s however. VBG resulted from earlier, though I suspect this may actually be an ABG given: 1) drawn from R hand and 2) O2 sat of 99% Cont home nebs

## 2022-12-09 NOTE — Assessment & Plan Note (Signed)
Hold Ozempic SSI sensitive Q4H for the moment

## 2022-12-09 NOTE — Evaluation (Signed)
Clinical/Bedside Swallow Evaluation Patient Details  Name: Michelle Gay MRN: [id number] Date of Birth: Nov 30, 1950  Today's Date: 12/09/2022 Time: SLP Start Time (ACUTE ONLY): 0272 SLP Stop Time (ACUTE ONLY): 0942 SLP Time Calculation (min) (ACUTE ONLY): 8 min  Past Medical History:  Past Medical History:  Diagnosis Date   COPD (chronic obstructive pulmonary disease) (HCC)    DM2 (diabetes mellitus, type 2) (HCC)    HTN (hypertension)    MCI (mild cognitive impairment)    Morbid obesity with BMI of 60.0-69.9, adult (HCC)    Past Surgical History: History reviewed. No pertinent surgical history. HPI:  Michelle Gay is a 72 yo female who presented to the ED by EMS due to altered mental status. Suspected septis from UTI with similar presentation to admission in May for same. CXR and Head CT 7/22 without acute findings. Pt with a pertinent PMH of COPD, HTN, T2DM, and mild cognitive impairment.    Assessment / Plan / Recommendation  Clinical Impression  Pt presents with functional swallowing as assessed clinically.  Pt tolerated all consistencies trialed, including large, serial straw sips of thin liquid with no clinical s/s of aspiration.  Pt is edentulous, but exhibited good oral clearance of regular solid cracker, and as of May of this year, pt consumes regular texture diet at her facility.  During her admission at that time she refused solids, but she accepted all trials offered today.  Pt may benefit from feeding assistance while altered, but appears safe to resume baseline diet.  Pt has no further ST needs at this time.  SLP will sign off.    Recommend regular texture diet with thin liquids.   SLP Visit Diagnosis: Dysphagia, unspecified (R13.10)    Aspiration Risk  No limitations    Diet Recommendation Regular;Thin liquid    Liquid Administration via: Cup;Straw Medication Administration:  (As tolerated, no specific precautions) Supervision: Staff to assist with self  feeding Compensations: Slow rate;Small sips/bites Postural Changes: Seated upright at 90 degrees    Other  Recommendations Oral Care Recommendations: Oral care BID    Recommendations for follow up therapy are one component of a multi-disciplinary discharge planning process, led by the attending physician.  Recommendations may be updated based on patient status, additional functional criteria and insurance authorization.  Follow up Recommendations No SLP follow up      Assistance Recommended at Discharge  N/A  Functional Status Assessment Patient has not had a recent decline in their functional status  Frequency and Duration  (N/A)          Prognosis Prognosis for improved oropharyngeal function:  (N/A)      Swallow Study   General Date of Onset: 12/08/22 HPI: Michelle Gay is a 72 yo female who presented to the ED by EMS due to altered mental status. Suspected septis from UTI with similar presentation to admission in May for same. CXR and Head CT 7/22 without acute findings. Pt with a pertinent PMH of COPD, HTN, T2DM, and mild cognitive impairment. Type of Study: Bedside Swallow Evaluation Previous Swallow Assessment: Clinical assessment in May of this year Diet Prior to this Study: NPO Temperature Spikes Noted: No Respiratory Status: Nasal cannula History of Recent Intubation: No Behavior/Cognition: Alert;Confused;Doesn't follow directions Oral Cavity Assessment:  (some slightly ropy secretions in oral cavity) Oral Care Completed by SLP: No Oral Cavity - Dentition: Edentulous Self-Feeding Abilities: Total assist Patient Positioning: Upright in bed Baseline Vocal Quality: Normal Volitional Cough: Cognitively unable to elicit Volitional Swallow: Unable to elicit  Oral/Motor/Sensory Function Overall Oral Motor/Sensory Function:  (Unable to assess, no asymmetry noted)   Ice Chips Ice chips: Not tested   Thin Liquid Thin Liquid: Within functional limits Presentation: Straw     Nectar Thick Nectar Thick Liquid: Not tested   Honey Thick Honey Thick Liquid: Not tested   Puree Puree: Within functional limits Presentation: Spoon   Solid     Solid: Within functional limits Presentation:  (SLP fed)      Kerrie Pleasure, MA, CCC-SLP Acute Rehabilitation Services Office: [phone] 12/09/2022,10:34 AM

## 2022-12-09 NOTE — Consult Note (Addendum)
WOC Nurse Consult Note: Reason for Consult: Consult requested for Stage 2 pressure injury to bilat buttocks; performed remotely after review of progress notes.   These can be treated independently by the bedside nurse; using the skin care order set in Epic as follows: Foam dressing to bilat buttocks, change Q 3 days or PRN soiling Pressure Injury POA: Yes Please re-consult if further assistance is needed.  Thank-you,  Cammie Mcgee MSN, RN, CWOCN, [place], CNS [phone]

## 2022-12-09 NOTE — Assessment & Plan Note (Addendum)
Patient meets criteria for sepsis at time of admission. Specifically the patient has at least 2 out of 4 SIRS criteria, namely: Tachycardia, tachypnea The currently suspected source of infection is UTI Lactic acid: 1.7 Blood pressure: 103/76 currently IVF: LR at 150 cc/hr, not bolused due to no hypotension and nl lactates Antibiotics: Rocephin and flagyl initially, now just rocephin Cultures pending H/o sepsis due to UTI with very similar presentation x2 months ago Mostly sensitive e.coli and Klebsellia at that time. UCx and BCx pending Tele monitor Cont empiric rocephin

## 2022-12-09 NOTE — ED Notes (Signed)
Pt placed on hospital bed

## 2022-12-10 DIAGNOSIS — G9341 Metabolic encephalopathy: Secondary | ICD-10-CM | POA: Diagnosis not present

## 2022-12-10 DIAGNOSIS — R6520 Severe sepsis without septic shock: Secondary | ICD-10-CM | POA: Diagnosis not present

## 2022-12-10 DIAGNOSIS — A419 Sepsis, unspecified organism: Secondary | ICD-10-CM | POA: Diagnosis not present

## 2022-12-10 LAB — GLUCOSE, CAPILLARY
Glucose-Capillary: 105 mg/dL — ABNORMAL HIGH (ref 70–99)
Glucose-Capillary: 117 mg/dL — ABNORMAL HIGH (ref 70–99)
Glucose-Capillary: 117 mg/dL — ABNORMAL HIGH (ref 70–99)
Glucose-Capillary: 121 mg/dL — ABNORMAL HIGH (ref 70–99)
Glucose-Capillary: 125 mg/dL — ABNORMAL HIGH (ref 70–99)
Glucose-Capillary: 132 mg/dL — ABNORMAL HIGH (ref 70–99)

## 2022-12-10 LAB — CULTURE, BLOOD (ROUTINE X 2): Culture: NO GROWTH

## 2022-12-10 LAB — URINE CULTURE

## 2022-12-10 MED ORDER — GABAPENTIN 400 MG PO CAPS
400.0000 mg | ORAL_CAPSULE | Freq: Two times a day (BID) | ORAL | Status: DC
  Administered 2022-12-10 – 2022-12-15 (×10): 400 mg via ORAL
  Filled 2022-12-10 (×10): qty 1

## 2022-12-10 NOTE — TOC CM/SW Note (Signed)
Transition of Care [facility]) - Inpatient Brief Assessment   Patient Details  Name: Michelle Gay MRN: [id number] Date of Birth: [birth year]  Transition of Care [facility]) CM/SW Contact:    Mearl Latin, LCSW Phone Number: 12/10/2022, 5:41 PM   Clinical Narrative: Patient admitted from [facility] term [facility]. CSW will continue to follow.    Transition of Care Asessment: Insurance and Status: Insurance coverage has been reviewed Patient has primary care physician: Yes Home environment has been reviewed: From SNF ltc Prior level of function:: bedbound Prior/Current Home Services: No current home services Social Determinants of Health Reivew: SDOH reviewed no interventions necessary Readmission risk has been reviewed: Yes Transition of care needs: transition of care needs identified, TOC will continue to follow

## 2022-12-10 NOTE — Progress Notes (Signed)
PROGRESS NOTE    Michelle Gay  MWN:[id number] DOB: [birth year] DOA: 12/08/2022 PCP: Justin Mend, MD    Chief Complaint  Patient presents with   Altered Mental Status    Brief Narrative:    Michelle Gay is a 72 y.o. female with medical history significant of COPD, DM2, HTN, MCI, morbid obesity. She presents to ED from facility with AMS, N/V and lower abd pain over past few days.  Increased urinary frequency.Pt satting 97% on RA when we can get a wave form.  - Of note, pt with very similar presentation with AMS due to UTI back in May.  Assessment & Plan:   Principal Problem:   Sepsis with acute organ dysfunction (HCC) Active Problems:   UTI (urinary tract infection)   COPD (chronic obstructive pulmonary disease) (HCC)   Acute encephalopathy   Decubitus ulcer of buttock, stage 2 (HCC)   DM2 (diabetes mellitus, type 2) (HCC)   HTN (hypertension)   Morbid obesity with BMI of 60.0-69.9, adult (HCC)   Severe sepsis (HCC)   Sepsis with acute organ dysfunction (HCC) -She presents, with tachypnea, tachycardia and altered mental status -Continue with IV Rocephin, follow on urine cultures -1 out of 4 blood culture growing Staphylococcus epidermidis, most likely contaminant, continue to monitor for final sensitivity  Decubitus ulcer of buttock, stage 2 (HCC) Bilateral stage 2 decubitus ulcers of buttocks, ~1cm diameter in each case.  No surrounding erythema, exudate, crepitus, etc to suggest active infection of these ulcers. -New with wound care   Acute encephalopathy Likely delirium secondary to UTI with sepsis.   - will decrease Gabapentin  COPD (chronic obstructive pulmonary disease) (HCC) No active wheezing for now, continue to monitor    Morbid obesity with BMI of 60.0-69.9, adult (HCC) Will hold ozempic for the moment.   HTN (hypertension) Hold home BP meds given initial soft BPs with sepsis.  Remains soft today, continue to hold   DM2 (diabetes  mellitus, type 2) (HCC) Hold Ozempic SSI sensitive Q4H for the moment  Rheumatoid arthritis - continue with methotrexate        DVT prophylaxis: Lovenox Code Status: Full Family Communication: None at bedside Disposition:   Status is: Inpatient    Consultants:  none   Subjective:  Significant events overnight as discussed with staff, she is unable to provide any reliable complaints today  Objective: Vitals:   12/09/22 2300 12/09/22 2358 12/10/22 0400 12/10/22 0800  BP:  125/62    Pulse:      Resp: 20     Temp:  98.8 F (37.1 C) 99.2 F (37.3 C) 98.6 F (37 C)  TempSrc:  Oral Oral Oral  SpO2:      Weight:      Height:        Intake/Output Summary (Last 24 hours) at 12/10/2022 1427 Last data filed at 12/10/2022 0500 Gross per 24 hour  Intake 437.12 ml  Output 360 ml  Net 77.12 ml   Filed Weights   12/08/22 1650  Weight: (!) 158.8 kg    Examination:  Awake Alert, altered, unable to answer questions appropriately, oriented x 1 Symmetrical Chest wall movement, Good air movement bilaterally, CTAB RRR,No Gallops,Rubs or new Murmurs, No Parasternal Heave +ve B.Sounds, Abd Soft, No tenderness, No rebound - guarding or rigidity. No Cyanosis, Clubbing appears to be with generalized edema.    Data Reviewed: I have personally reviewed following labs and imaging studies  CBC: Recent Labs  Lab 12/08/22 1702 12/08/22 1756 12/09/22  0047 12/09/22 0459  WBC 7.4  --   --  7.6  NEUTROABS 4.5  --   --   --   HGB 10.7* 12.6 11.6* 10.3*  HCT 37.0 37.0 34.0* 34.4*  MCV 95.4  --   --  93.5  PLT 231  --   --  212    Basic Metabolic Panel: Recent Labs  Lab 12/08/22 1702 12/08/22 1756 12/09/22 0047 12/09/22 0459  NA 139 140 139 137  K 4.2 4.3 4.0 4.2  CL 98  --   --  104  CO2 22  --   --  22  GLUCOSE 160*  --   --  143*  BUN 21  --   --  21  CREATININE 1.25*  --   --  1.26*  CALCIUM 10.0  --   --  9.3    GFR: Estimated Creatinine Clearance: 63.2  mL/min (A) (by C-G formula based on SCr of 1.26 mg/dL (H)).  Liver Function Tests: Recent Labs  Lab 12/08/22 1702 12/09/22 0459  AST 27 21  ALT 27 21  ALKPHOS 96 83  BILITOT 0.5 0.7  PROT 8.1 7.6  ALBUMIN 2.9* 2.6*    CBG: Recent Labs  Lab 12/09/22 2108 12/09/22 2357 12/10/22 0438 12/10/22 0842 12/10/22 1244  GLUCAP 113* 121* 105* 117* 132*     Recent Results (from the past 240 hour(s))  Blood Culture (routine x 2)     Status: None (Preliminary result)   Collection Time: 12/08/22  4:52 PM   Specimen: BLOOD  Result Value Ref Range Status   Specimen Description BLOOD SITE NOT SPECIFIED  Final   Special Requests   Final    BOTTLES DRAWN AEROBIC AND ANAEROBIC Blood Culture adequate volume   Culture   Final    NO GROWTH 2 DAYS Performed at [facility], [address] [place], [place] [address]    Report Status PENDING  Incomplete  Blood Culture (routine x 2)     Status: Abnormal (Preliminary result)   Collection Time: 12/08/22  5:06 PM   Specimen: BLOOD  Result Value Ref Range Status   Specimen Description BLOOD LEFT ANTECUBITAL  Final   Special Requests   Final    BOTTLES DRAWN AEROBIC AND ANAEROBIC Blood Culture adequate volume   Culture  Setup Time   Final    GRAM POSITIVE COCCI BOTTLES DRAWN AEROBIC ONLY CRITICAL RESULT CALLED TO, READ BACK BY AND VERIFIED WITH: PHARMD ERIN WILLIAMSON ON 12/09/22 @ 1948 BY DRT    Culture (A)  Final    STAPHYLOCOCCUS EPIDERMIDIS THE SIGNIFICANCE OF ISOLATING THIS ORGANISM FROM A SINGLE SET OF BLOOD CULTURES WHEN MULTIPLE SETS ARE DRAWN IS UNCERTAIN. PLEASE NOTIFY THE MICROBIOLOGY DEPARTMENT WITHIN ONE WEEK IF SPECIATION AND SENSITIVITIES ARE REQUIRED. Performed at [facility], [address] [place], [place] [address]    Report Status PENDING  Incomplete  Blood Culture ID Panel (Reflexed)     Status: Abnormal   Collection Time: 12/08/22  5:06 PM  Result Value Ref Range Status   Enterococcus faecalis NOT  DETECTED NOT DETECTED Final   Enterococcus Faecium NOT DETECTED NOT DETECTED Final   Listeria monocytogenes NOT DETECTED NOT DETECTED Final   Staphylococcus species DETECTED (A) NOT DETECTED Final    Comment: CRITICAL RESULT CALLED TO, READ BACK BY AND VERIFIED WITH: PHARMD ERIN WILLIAMSON ON 12/09/22 @ 1948 BY DRT    Staphylococcus aureus (BCID) NOT DETECTED NOT DETECTED Final   Staphylococcus  epidermidis DETECTED (A) NOT DETECTED Final    Comment: Methicillin (oxacillin) resistant coagulase negative staphylococcus. Possible blood culture contaminant (unless isolated from more than one blood culture draw or clinical case suggests pathogenicity). No antibiotic treatment is indicated for blood  culture contaminants. CRITICAL RESULT CALLED TO, READ BACK BY AND VERIFIED WITH: PHARMD ERIN WILLIAMSON ON 12/09/22 @ 1948 BY DRT    Staphylococcus lugdunensis NOT DETECTED NOT DETECTED Final   Streptococcus species NOT DETECTED NOT DETECTED Final   Streptococcus agalactiae NOT DETECTED NOT DETECTED Final   Streptococcus pneumoniae NOT DETECTED NOT DETECTED Final   Streptococcus pyogenes NOT DETECTED NOT DETECTED Final   A.calcoaceticus-baumannii NOT DETECTED NOT DETECTED Final   Bacteroides fragilis NOT DETECTED NOT DETECTED Final   Enterobacterales NOT DETECTED NOT DETECTED Final   Enterobacter cloacae complex NOT DETECTED NOT DETECTED Final   Escherichia coli NOT DETECTED NOT DETECTED Final   Klebsiella aerogenes NOT DETECTED NOT DETECTED Final   Klebsiella oxytoca NOT DETECTED NOT DETECTED Final   Klebsiella pneumoniae NOT DETECTED NOT DETECTED Final   Proteus species NOT DETECTED NOT DETECTED Final   Salmonella species NOT DETECTED NOT DETECTED Final   Serratia marcescens NOT DETECTED NOT DETECTED Final   Haemophilus influenzae NOT DETECTED NOT DETECTED Final   Neisseria meningitidis NOT DETECTED NOT DETECTED Final   Pseudomonas aeruginosa NOT DETECTED NOT DETECTED Final   Stenotrophomonas  maltophilia NOT DETECTED NOT DETECTED Final   Candida albicans NOT DETECTED NOT DETECTED Final   Candida auris NOT DETECTED NOT DETECTED Final   Candida glabrata NOT DETECTED NOT DETECTED Final   Candida krusei NOT DETECTED NOT DETECTED Final   Candida parapsilosis NOT DETECTED NOT DETECTED Final   Candida tropicalis NOT DETECTED NOT DETECTED Final   Cryptococcus neoformans/gattii NOT DETECTED NOT DETECTED Final   Methicillin resistance mecA/C DETECTED (A) NOT DETECTED Final    Comment: CRITICAL RESULT CALLED TO, READ BACK BY AND VERIFIED WITH: PHARMD ERIN WILLIAMSON ON 12/09/22 @ 1948 BY DRT Performed at [facility], [address], [place], [place] [address]   Resp panel by RT-PCR (RSV, Flu A&B, Covid) Anterior Nasal Swab     Status: None   Collection Time: 12/08/22  5:10 PM   Specimen: Anterior Nasal Swab  Result Value Ref Range Status   SARS Coronavirus 2 by RT PCR NEGATIVE NEGATIVE Final   Influenza A by PCR NEGATIVE NEGATIVE Final   Influenza B by PCR NEGATIVE NEGATIVE Final    Comment: (NOTE) The Xpert Xpress SARS-CoV-2/FLU/RSV plus assay is intended as an aid in the diagnosis of influenza from Nasopharyngeal swab specimens and should not be used as a sole basis for treatment. Nasal washings and aspirates are unacceptable for Xpert Xpress SARS-CoV-2/FLU/RSV testing.  Fact Sheet for Patients: BloggerCourse.com  Fact Sheet for Healthcare Providers: SeriousBroker.it  This test is not yet approved or cleared by the [place] FDA and has been authorized for detection and/or diagnosis of SARS-CoV-2 by FDA under an Emergency Use Authorization (EUA). This EUA will remain in effect (meaning this test can be used) for the duration of the COVID-19 declaration under Section 564(b)(1) of the Act, 21 U.S.C. section 360bbb-3(b)(1), unless the authorization is terminated or revoked.     Resp Syncytial Virus by PCR NEGATIVE  NEGATIVE Final    Comment: (NOTE) Fact Sheet for Patients: BloggerCourse.com  Fact Sheet for Healthcare Providers: SeriousBroker.it  This test is not yet approved or cleared by the [place] FDA and has been authorized for detection and/or  diagnosis of SARS-CoV-2 by FDA under an Emergency Use Authorization (EUA). This EUA will remain in effect (meaning this test can be used) for the duration of the COVID-19 declaration under Section 564(b)(1) of the Act, 21 U.S.C. section 360bbb-3(b)(1), unless the authorization is terminated or revoked.  Performed at [facility], [address], [place], [place] [address]   Urine Culture (for pregnant, neutropenic or urologic patients or patients with an indwelling urinary catheter)     Status: Abnormal (Preliminary result)   Collection Time: 12/09/22  9:27 AM   Specimen: Urine, Clean Catch  Result Value Ref Range Status   Specimen Description URINE, CLEAN CATCH  Final   Special Requests   Final    NONE Performed at [facility], [address], [place], [place] [address]    Culture 60,000 COLONIES/mL ESCHERICHIA COLI (A)  Final   Report Status PENDING  Incomplete         Radiology Studies: CT Head Wo Contrast  Result Date: 12/08/2022 CLINICAL DATA:  Altered mental status, nontraumatic (Ped 0-17y) EXAM: CT HEAD WITHOUT CONTRAST TECHNIQUE: Contiguous axial images were obtained from the base of the skull through the vertex without intravenous contrast. RADIATION DOSE REDUCTION: This exam was performed according to the departmental dose-optimization program which includes automated exposure control, adjustment of the mA and/or kV according to patient size and/or use of iterative reconstruction technique. COMPARISON:  09/17/2022 FINDINGS: Brain: No acute intracranial abnormality. Specifically, no hemorrhage, hydrocephalus, mass lesion, acute infarction, or significant intracranial  injury. Vascular: No hyperdense vessel or unexpected calcification. Skull: No acute calvarial abnormality. Sinuses/Orbits: No acute findings Other: None IMPRESSION: No acute intracranial abnormality. Electronically Signed   By: Charlett Nose M.D.   On: 12/08/2022 20:35   DG Chest Portable 1 View  Result Date: 12/08/2022 CLINICAL DATA:  Shortness of breath. EXAM: PORTABLE CHEST 1 VIEW COMPARISON:  Sep 17, 2022. FINDINGS: Stable cardiomegaly. No acute pulmonary disease is noted. Bony thorax is unremarkable. IMPRESSION: No active disease. Electronically Signed   By: Lupita Raider M.D.   On: 12/08/2022 18:12        Scheduled Meds:  aspirin  81 mg Oral Daily   atorvastatin  10 mg Oral QHS   budesonide  0.5 mg Nebulization q12n4p   calcium carbonate  2 tablet Oral Daily   cholecalciferol  2,000 Units Oral Daily   diclofenac Sodium  4 g Topical Daily   DULoxetine  60 mg Oral Daily   enoxaparin (LOVENOX) injection  80 mg Subcutaneous Q24H   famotidine  20 mg Oral Daily   ferrous sulfate  325 mg Oral Q breakfast   fluticasone  1 spray Each Nare Daily   folic acid  1 mg Oral Daily   gabapentin  800 mg Oral BID   insulin aspart  0-9 Units Subcutaneous Q4H   methotrexate  5 mg Oral Weekly   montelukast  10 mg Oral Daily   oxyCODONE  5 mg Oral BID   polyethylene glycol  17 g Oral Daily   senna-docusate  2 tablet Oral Daily   topiramate  25 mg Oral BID   Continuous Infusions:  cefTRIAXone (ROCEPHIN)  IV Stopped (12/09/22 2207)   metronidazole 500 mg (12/10/22 0911)     LOS: 1 day     Huey Bienenstock, MD [facility]   To contact the attending provider between 7A-7P or the covering provider during after hours 7P-7A, please log into the web site [email or web address] and access using universal  [place] password for that web site. If you do not have the password, please call the hospital operator.  12/10/2022, 2:27 PM

## 2022-12-11 DIAGNOSIS — A419 Sepsis, unspecified organism: Secondary | ICD-10-CM | POA: Diagnosis not present

## 2022-12-11 DIAGNOSIS — R6520 Severe sepsis without septic shock: Secondary | ICD-10-CM | POA: Diagnosis not present

## 2022-12-11 DIAGNOSIS — G9341 Metabolic encephalopathy: Secondary | ICD-10-CM | POA: Diagnosis not present

## 2022-12-11 LAB — GLUCOSE, CAPILLARY
Glucose-Capillary: 107 mg/dL — ABNORMAL HIGH (ref 70–99)
Glucose-Capillary: 115 mg/dL — ABNORMAL HIGH (ref 70–99)
Glucose-Capillary: 121 mg/dL — ABNORMAL HIGH (ref 70–99)
Glucose-Capillary: 129 mg/dL — ABNORMAL HIGH (ref 70–99)
Glucose-Capillary: 130 mg/dL — ABNORMAL HIGH (ref 70–99)
Glucose-Capillary: 138 mg/dL — ABNORMAL HIGH (ref 70–99)
Glucose-Capillary: 139 mg/dL — ABNORMAL HIGH (ref 70–99)

## 2022-12-11 LAB — BASIC METABOLIC PANEL
Anion gap: 11 (ref 5–15)
BUN: 24 mg/dL — ABNORMAL HIGH (ref 8–23)
CO2: 24 mmol/L (ref 22–32)
Calcium: 8.9 mg/dL (ref 8.9–10.3)
Chloride: 100 mmol/L (ref 98–111)
Creatinine, Ser: 1.12 mg/dL — ABNORMAL HIGH (ref 0.44–1.00)
GFR, Estimated: 53 mL/min — ABNORMAL LOW (ref >60)
Potassium: 3.7 mmol/L (ref 3.5–5.1)

## 2022-12-11 LAB — CBC
HCT: 29.9 % — ABNORMAL LOW (ref 36.0–46.0)
Hemoglobin: 9.2 g/dL — ABNORMAL LOW (ref 12.0–15.0)
MCH: 28 pg (ref 26.0–34.0)
MCV: 90.9 fL (ref 80.0–100.0)
Platelets: 226 10*3/uL (ref 150–400)
RBC: 3.29 MIL/uL — ABNORMAL LOW (ref 3.87–5.11)
RDW: 15.2 % (ref 11.5–15.5)
WBC: 5.4 10*3/uL (ref 4.0–10.5)
nRBC: 0 % (ref 0.0–0.2)

## 2022-12-11 LAB — CULTURE, BLOOD (ROUTINE X 2)

## 2022-12-11 LAB — URINE CULTURE: Culture: 60000 — AB

## 2022-12-11 MED ORDER — SODIUM CHLORIDE 0.9 % IV SOLN
1.0000 g | Freq: Three times a day (TID) | INTRAVENOUS | Status: DC
  Administered 2022-12-11 – 2022-12-14 (×10): 1 g via INTRAVENOUS
  Filled 2022-12-11 (×11): qty 20

## 2022-12-11 NOTE — Progress Notes (Addendum)
Pharmacy Antibiotic Note  Michelle Gay is a 72 y.o. female admitted on 12/08/2022 with sepsis 2/2 presumed UTI. She presented to the ED with AMS, n/v, lower abdominal pain and increased urinary frequency. She was initiated on CTX and flagyl for symptomatic UTI. Ucx now growing 60,000 colonies of ESBL E. Coli. Pharmacy has been consulted for meropenem dosing.   Bcx remain negative, patient is afebrile, wbc wnl, Scr 1.12 (down from 1.26, BL < 1)  Plan: Discontinue Ceftriaxone and flagyl Start Meropenem 1g Q8h IV Monitor renal function, clinical progression and culture results for de-escalation  Height: 5\' 4"  (162.6 cm) Weight: (!) 158.8 kg (350 lb) (pt unable to answer) IBW/kg (Calculated) : 54.7  Temp (24hrs), Avg:98.3 F (36.8 C), Min:97.8 F (36.6 C), Max:99 F (37.2 C)  Recent Labs  Lab 12/08/22 1702 12/08/22 1757 12/09/22 0129 12/09/22 0459 12/11/22 0259  WBC 7.4  --   --  7.6 5.4  CREATININE 1.25*  --   --  1.26* 1.12*  LATICACIDVEN  --  1.7 1.0 1.4  --     Estimated Creatinine Clearance: 71.1 mL/min (A) (by C-G formula based on SCr of 1.12 mg/dL (H)).    No Known Allergies  Antimicrobials this admission: CTX 7/22 >> 7/25 Flagyl 7/22 >>  Meropenem 7/25 >>  Microbiology results: 7/22 BCx: staph epi in 1/4 vials - likely contaminant 7/23 UCx: ESBL E. Coli  7/22 Flu/COVID negative  Thank you for allowing pharmacy to be a part of this patient's care.  Rennis Petty 12/11/2022 8:43 AM

## 2022-12-11 NOTE — Plan of Care (Signed)

## 2022-12-11 NOTE — Progress Notes (Signed)
PROGRESS NOTE    Michelle Gay  QMV:[id number] DOB: [birth year] DOA: 12/08/2022 PCP: Justin Mend, MD    Chief Complaint  Patient presents with   Altered Mental Status    Brief Narrative:    Michelle Gay is a 72 y.o. female with medical history significant of COPD, DM2, HTN, MCI, morbid obesity. She presents to ED from facility with AMS, N/V and lower abd pain over past few days.  Increased urinary frequency.Pt satting 97% on RA when we can get a wave form.  - Of note, pt with very similar presentation with AMS due to UTI back in May.  Assessment & Plan:   Principal Problem:   Sepsis with acute organ dysfunction (HCC) Active Problems:   UTI (urinary tract infection)   COPD (chronic obstructive pulmonary disease) (HCC)   Acute encephalopathy   Decubitus ulcer of buttock, stage 2 (HCC)   DM2 (diabetes mellitus, type 2) (HCC)   HTN (hypertension)   Morbid obesity with BMI of 60.0-69.9, adult (HCC)   Severe sepsis (HCC)   Sepsis with acute organ dysfunction (HCC) -She presents, with tachypnea, tachycardia and altered mental status -Culture growing ESBL, will change Rocephin to meropenem. -1 out of 4 blood culture growing Staphylococcus epidermidis, most likely contaminant, continue to monitor for final sensitivity  Decubitus ulcer of buttock, stage 2 (HCC) Bilateral stage 2 decubitus ulcers of buttocks, ~1cm diameter in each case.  No surrounding erythema, exudate, crepitus, etc to suggest active infection of these ulcers. -New with wound care   Acute encephalopathy Likely delirium secondary to UTI with sepsis.   - will decrease Gabapentin  COPD (chronic obstructive pulmonary disease) (HCC) No active wheezing for now, continue to monitor    Morbid obesity with BMI of 60.0-69.9, adult (HCC) Will hold ozempic for the moment.   HTN (hypertension) Hold home BP meds given initial soft BPs with sepsis.  Remains soft today, continue to hold   DM2 (diabetes  mellitus, type 2) (HCC) Hold Ozempic SSI sensitive Q4H for the moment  Rheumatoid arthritis - continue with methotrexate        DVT prophylaxis: Lovenox Code Status: Full Family Communication: None at bedside Disposition:   Status is: Inpatient    Consultants:  none   Subjective:  No significant events overnight as discussed with staff, she denies any complaints today.  Objective: Vitals:   12/11/22 0300 12/11/22 0754 12/11/22 0806 12/11/22 1141  BP: (!) 135/59 (!) 119/55  116/62  Pulse:  98  98  Resp:  (!) 33  17  Temp: 97.8 F (36.6 C) 98 F (36.7 C)  98.6 F (37 C)  TempSrc: Oral Oral  Oral  SpO2:  100% 96% 100%  Weight:      Height:        Intake/Output Summary (Last 24 hours) at 12/11/2022 1545 Last data filed at 12/11/2022 1300 Gross per 24 hour  Intake 879.08 ml  Output 600 ml  Net 279.08 ml   Filed Weights   12/08/22 1650  Weight: (!) 158.8 kg    Examination:  He is more awake, alert and appropriate today, conversant and pleasant Symmetrical Chest wall movement, Good air movement bilaterally, CTAB RRR,No Gallops,Rubs or new Murmurs, No Parasternal Heave +ve B.Sounds, Abd Soft, No tenderness, No rebound - guarding or rigidity. No Cyanosis, Clubbing, generalized edema, bilateral foot drop, chronic    Data Reviewed: I have personally reviewed following labs and imaging studies  CBC: Recent Labs  Lab 12/08/22 1702 12/08/22 1756 12/09/22  4098 12/09/22 0459 12/11/22 0259  WBC 7.4  --   --  7.6 5.4  NEUTROABS 4.5  --   --   --   --   HGB 10.7* 12.6 11.6* 10.3* 9.2*  HCT 37.0 37.0 34.0* 34.4* 29.9*  MCV 95.4  --   --  93.5 90.9  PLT 231  --   --  212 226    Basic Metabolic Panel: Recent Labs  Lab 12/08/22 1702 12/08/22 1756 12/09/22 0047 12/09/22 0459 12/11/22 0259  NA 139 140 139 137 135  K 4.2 4.3 4.0 4.2 3.7  CL 98  --   --  104 100  CO2 22  --   --  22 24  GLUCOSE 160*  --   --  143* 126*  BUN 21  --   --  21 24*   CREATININE 1.25*  --   --  1.26* 1.12*  CALCIUM 10.0  --   --  9.3 8.9    GFR: Estimated Creatinine Clearance: 71.1 mL/min (A) (by C-G formula based on SCr of 1.12 mg/dL (H)).  Liver Function Tests: Recent Labs  Lab 12/08/22 1702 12/09/22 0459  AST 27 21  ALT 27 21  ALKPHOS 96 83  BILITOT 0.5 0.7  PROT 8.1 7.6  ALBUMIN 2.9* 2.6*    CBG: Recent Labs  Lab 12/10/22 2033 12/11/22 0009 12/11/22 0341 12/11/22 0833 12/11/22 1140  GLUCAP 125* 121* 107* 115* 138*     Recent Results (from the past 240 hour(s))  Blood Culture (routine x 2)     Status: None (Preliminary result)   Collection Time: 12/08/22  4:52 PM   Specimen: BLOOD  Result Value Ref Range Status   Specimen Description BLOOD SITE NOT SPECIFIED  Final   Special Requests   Final    BOTTLES DRAWN AEROBIC AND ANAEROBIC Blood Culture adequate volume   Culture   Final    NO GROWTH 3 DAYS Performed at [facility], [address], [place], [place] [address]    Report Status PENDING  Incomplete  Blood Culture (routine x 2)     Status: Abnormal (Preliminary result)   Collection Time: 12/08/22  5:06 PM   Specimen: BLOOD  Result Value Ref Range Status   Specimen Description BLOOD LEFT ANTECUBITAL  Final   Special Requests   Final    BOTTLES DRAWN AEROBIC AND ANAEROBIC Blood Culture adequate volume   Culture  Setup Time   Final    GRAM POSITIVE COCCI IN BOTH AEROBIC AND ANAEROBIC BOTTLES CRITICAL RESULT CALLED TO, READ BACK BY AND VERIFIED WITH: PHARMD ERIN WILLIAMSON ON 12/09/22 @ 1948 BY DRT    Culture (A)  Final    STAPHYLOCOCCUS EPIDERMIDIS THE SIGNIFICANCE OF ISOLATING THIS ORGANISM FROM A SINGLE SET OF BLOOD CULTURES WHEN MULTIPLE SETS ARE DRAWN IS UNCERTAIN. PLEASE NOTIFY THE MICROBIOLOGY DEPARTMENT WITHIN ONE WEEK IF SPECIATION AND SENSITIVITIES ARE REQUIRED. Performed at [facility], [address] [place], [place] [address]    Report Status PENDING  Incomplete  Blood Culture ID Panel  (Reflexed)     Status: Abnormal   Collection Time: 12/08/22  5:06 PM  Result Value Ref Range Status   Enterococcus faecalis NOT DETECTED NOT DETECTED Final   Enterococcus Faecium NOT DETECTED NOT DETECTED Final   Listeria monocytogenes NOT DETECTED NOT DETECTED Final   Staphylococcus species DETECTED (A) NOT DETECTED Final    Comment: CRITICAL RESULT CALLED TO, READ BACK BY AND VERIFIED WITH: PHARMD  ERIN WILLIAMSON ON 12/09/22 @ 1948 BY DRT    Staphylococcus aureus (BCID) NOT DETECTED NOT DETECTED Final   Staphylococcus epidermidis DETECTED (A) NOT DETECTED Final    Comment: Methicillin (oxacillin) resistant coagulase negative staphylococcus. Possible blood culture contaminant (unless isolated from more than one blood culture draw or clinical case suggests pathogenicity). No antibiotic treatment is indicated for blood  culture contaminants. CRITICAL RESULT CALLED TO, READ BACK BY AND VERIFIED WITH: PHARMD ERIN WILLIAMSON ON 12/09/22 @ 1948 BY DRT    Staphylococcus lugdunensis NOT DETECTED NOT DETECTED Final   Streptococcus species NOT DETECTED NOT DETECTED Final   Streptococcus agalactiae NOT DETECTED NOT DETECTED Final   Streptococcus pneumoniae NOT DETECTED NOT DETECTED Final   Streptococcus pyogenes NOT DETECTED NOT DETECTED Final   A.calcoaceticus-baumannii NOT DETECTED NOT DETECTED Final   Bacteroides fragilis NOT DETECTED NOT DETECTED Final   Enterobacterales NOT DETECTED NOT DETECTED Final   Enterobacter cloacae complex NOT DETECTED NOT DETECTED Final   Escherichia coli NOT DETECTED NOT DETECTED Final   Klebsiella aerogenes NOT DETECTED NOT DETECTED Final   Klebsiella oxytoca NOT DETECTED NOT DETECTED Final   Klebsiella pneumoniae NOT DETECTED NOT DETECTED Final   Proteus species NOT DETECTED NOT DETECTED Final   Salmonella species NOT DETECTED NOT DETECTED Final   Serratia marcescens NOT DETECTED NOT DETECTED Final   Haemophilus influenzae NOT DETECTED NOT DETECTED Final    Neisseria meningitidis NOT DETECTED NOT DETECTED Final   Pseudomonas aeruginosa NOT DETECTED NOT DETECTED Final   Stenotrophomonas maltophilia NOT DETECTED NOT DETECTED Final   Candida albicans NOT DETECTED NOT DETECTED Final   Candida auris NOT DETECTED NOT DETECTED Final   Candida glabrata NOT DETECTED NOT DETECTED Final   Candida krusei NOT DETECTED NOT DETECTED Final   Candida parapsilosis NOT DETECTED NOT DETECTED Final   Candida tropicalis NOT DETECTED NOT DETECTED Final   Cryptococcus neoformans/gattii NOT DETECTED NOT DETECTED Final   Methicillin resistance mecA/C DETECTED (A) NOT DETECTED Final    Comment: CRITICAL RESULT CALLED TO, READ BACK BY AND VERIFIED WITH: PHARMD ERIN WILLIAMSON ON 12/09/22 @ 1948 BY DRT Performed at [facility], [address] [place], [place] [address]   Resp panel by RT-PCR (RSV, Flu A&B, Covid) Anterior Nasal Swab     Status: None   Collection Time: 12/08/22  5:10 PM   Specimen: Anterior Nasal Swab  Result Value Ref Range Status   SARS Coronavirus 2 by RT PCR NEGATIVE NEGATIVE Final   Influenza A by PCR NEGATIVE NEGATIVE Final   Influenza B by PCR NEGATIVE NEGATIVE Final    Comment: (NOTE) The Xpert Xpress SARS-CoV-2/FLU/RSV plus assay is intended as an aid in the diagnosis of influenza from Nasopharyngeal swab specimens and should not be used as a sole basis for treatment. Nasal washings and aspirates are unacceptable for Xpert Xpress SARS-CoV-2/FLU/RSV testing.  Fact Sheet for Patients: BloggerCourse.com  Fact Sheet for Healthcare Providers: SeriousBroker.it  This test is not yet approved or cleared by the [place] FDA and has been authorized for detection and/or diagnosis of SARS-CoV-2 by FDA under an Emergency Use Authorization (EUA). This EUA will remain in effect (meaning this test can be used) for the duration of the COVID-19 declaration under Section 564(b)(1) of the Act,  21 U.S.C. section 360bbb-3(b)(1), unless the authorization is terminated or revoked.     Resp Syncytial Virus by PCR NEGATIVE NEGATIVE Final    Comment: (NOTE) Fact Sheet for Patients: BloggerCourse.com  Fact Sheet for Healthcare Providers:  SeriousBroker.it  This test is not yet approved or cleared by the [place] and has been authorized for detection and/or diagnosis of SARS-CoV-2 by FDA under an Emergency Use Authorization (EUA). This EUA will remain in effect (meaning this test can be used) for the duration of the COVID-19 declaration under Section 564(b)(1) of the Act, 21 U.S.C. section 360bbb-3(b)(1), unless the authorization is terminated or revoked.  Performed at [facility], [address] [place], [place] [address]   Urine Culture (for pregnant, neutropenic or urologic patients or patients with an indwelling urinary catheter)     Status: Abnormal   Collection Time: 12/09/22  9:27 AM   Specimen: Urine, Clean Catch  Result Value Ref Range Status   Specimen Description URINE, CLEAN CATCH  Final   Special Requests   Final    NONE Performed at [facility], [address] [place], [place] [address]    Culture (A)  Final    60,000 COLONIES/mL ESCHERICHIA COLI Confirmed Extended Spectrum Beta-Lactamase Producer (ESBL).  In bloodstream infections from ESBL organisms, carbapenems are preferred over piperacillin/tazobactam. They are shown to have a lower risk of mortality.    Report Status 12/11/2022 FINAL  Final   Organism ID, Bacteria ESCHERICHIA COLI (A)  Final      Susceptibility   Escherichia coli - MIC*    AMPICILLIN >=32 RESISTANT Resistant     CEFAZOLIN >=64 RESISTANT Resistant     CEFEPIME >=32 RESISTANT Resistant     CEFTRIAXONE >=64 RESISTANT Resistant     CIPROFLOXACIN >=4 RESISTANT Resistant     GENTAMICIN <=1 SENSITIVE Sensitive     IMIPENEM <=0.25 SENSITIVE Sensitive      NITROFURANTOIN <=16 SENSITIVE Sensitive     TRIMETH/SULFA 40 SENSITIVE Sensitive     AMPICILLIN/SULBACTAM >=32 RESISTANT Resistant     PIP/TAZO 8 SENSITIVE Sensitive     * 60,000 COLONIES/mL ESCHERICHIA COLI         Radiology Studies: No results found.      Scheduled Meds:  aspirin  81 mg Oral Daily   atorvastatin  10 mg Oral QHS   budesonide  0.5 mg Nebulization q12n4p   calcium carbonate  2 tablet Oral Daily   cholecalciferol  2,000 Units Oral Daily   diclofenac Sodium  4 g Topical Daily   DULoxetine  60 mg Oral Daily   enoxaparin (LOVENOX) injection  80 mg Subcutaneous Q24H   famotidine  20 mg Oral Daily   ferrous sulfate  325 mg Oral Q breakfast   fluticasone  1 spray Each Nare Daily   folic acid  1 mg Oral Daily   gabapentin  400 mg Oral BID   insulin aspart  0-9 Units Subcutaneous Q4H   methotrexate  5 mg Oral Weekly   montelukast  10 mg Oral Daily   oxyCODONE  5 mg Oral BID   polyethylene glycol  17 g Oral Daily   senna-docusate  2 tablet Oral Daily   topiramate  25 mg Oral BID   Continuous Infusions:  meropenem (MERREM) IV 1 g (12/11/22 1324)     LOS: 2 days     Huey Bienenstock, MD [facility]   To contact the attending provider between 7A-7P or the covering provider during after hours 7P-7A, please log into the web site [email or web address] and access using universal [place] password for that web site. If you do not have the password, please call the hospital operator.  12/11/2022, 3:45 PM

## 2022-12-12 DIAGNOSIS — G9341 Metabolic encephalopathy: Secondary | ICD-10-CM | POA: Diagnosis not present

## 2022-12-12 DIAGNOSIS — R6520 Severe sepsis without septic shock: Secondary | ICD-10-CM | POA: Diagnosis not present

## 2022-12-12 DIAGNOSIS — A419 Sepsis, unspecified organism: Secondary | ICD-10-CM | POA: Diagnosis not present

## 2022-12-12 DIAGNOSIS — G9340 Encephalopathy, unspecified: Secondary | ICD-10-CM | POA: Diagnosis not present

## 2022-12-12 LAB — GLUCOSE, CAPILLARY
Glucose-Capillary: 111 mg/dL — ABNORMAL HIGH (ref 70–99)
Glucose-Capillary: 116 mg/dL — ABNORMAL HIGH (ref 70–99)
Glucose-Capillary: 118 mg/dL — ABNORMAL HIGH (ref 70–99)
Glucose-Capillary: 125 mg/dL — ABNORMAL HIGH (ref 70–99)
Glucose-Capillary: 136 mg/dL — ABNORMAL HIGH (ref 70–99)
Glucose-Capillary: 162 mg/dL — ABNORMAL HIGH (ref 70–99)

## 2022-12-12 NOTE — Plan of Care (Signed)

## 2022-12-12 NOTE — Progress Notes (Signed)
PROGRESS NOTE    Michelle Gay  ZOX:[id number] DOB: August 09, 1950 DOA: 12/08/2022 PCP: Justin Mend, MD    Chief Complaint  Patient presents with   Altered Mental Status    Brief Narrative:    Michelle Gay is a 72 y.o. female with medical history significant of COPD, DM2, HTN, MCI, morbid obesity. She presents to ED from facility with AMS, N/V and lower abd pain over past few days.  Increased urinary frequency.Pt satting 97% on RA when we can get a wave form.  - Of note, pt with very similar presentation with AMS due to UTI back in May.  Assessment & Plan:   Principal Problem:   Sepsis with acute organ dysfunction (HCC) Active Problems:   UTI (urinary tract infection)   COPD (chronic obstructive pulmonary disease) (HCC)   Acute encephalopathy   Decubitus ulcer of buttock, stage 2 (HCC)   DM2 (diabetes mellitus, type 2) (HCC)   HTN (hypertension)   Morbid obesity with BMI of 60.0-69.9, adult (HCC)   Severe sepsis (HCC)   Sepsis with acute organ dysfunction (HCC) -She presents, with tachypnea, tachycardia and altered mental status -Culture growing ESBL, will change Rocephin to meropenem.  Will need to finish total of 5 days, today # 2/5 -1 out of 4 blood culture growing Staphylococcus epidermidis, most likely contaminant, continue to monitor for final sensitivity  Decubitus ulcer of buttock, stage 2 (HCC) Bilateral stage 2 decubitus ulcers of buttocks, ~1cm diameter in each case.  No surrounding erythema, exudate, crepitus, etc to suggest active infection of these ulcers. -New with wound care   Acute encephalopathy Likely delirium secondary to UTI with sepsis.   - will decrease Gabapentin -Mentation much improved  COPD (chronic obstructive pulmonary disease) (HCC) No active wheezing for now, continue to monitor    Morbid obesity with BMI of 60.0-69.9, adult (HCC) Will hold ozempic for the moment.   HTN (hypertension) Hold home BP meds given initial soft  BPs with sepsis.  Remains soft today, continue to hold   DM2 (diabetes mellitus, type 2) (HCC) Hold Ozempic SSI sensitive Q4H for the moment  Rheumatoid arthritis - continue with methotrexate        DVT prophylaxis: Lovenox Code Status: Full Family Communication: None at bedside, tried to reach niece by phone with no availability. Disposition:   Status is: Inpatient    Consultants:  none   Subjective:  No significant events overnight as discussed with staff, she denies any complaints today  Objective: Vitals:   12/12/22 1125 12/12/22 1200 12/12/22 1239 12/12/22 1400  BP:  (!) 118/94 (!) 90/59   Pulse: 99  (!) 101   Resp: 20 19 (!) 24 16  Temp:   98.4 F (36.9 C)   TempSrc:  Oral Axillary   SpO2: 94% 97% 98%   Weight:      Height:        Intake/Output Summary (Last 24 hours) at 12/12/2022 1430 Last data filed at 12/12/2022 1300 Gross per 24 hour  Intake 1020.06 ml  Output --  Net 1020.06 ml   Filed Weights   12/08/22 1650  Weight: (!) 158.8 kg    Examination:  Awake alert, pleasant, communicative, decreased cognition and insight, oriented x 2 Symmetrical Chest wall movement, Good air movement bilaterally, CTAB RRR,No Gallops,Rubs or new Murmurs, No Parasternal Heave +ve B.Sounds, Abd Soft, No tenderness, No rebound - guarding or rigidity. No Cyanosis, Clubbing generalized edema, with bilateral foot drop, chronic bilateral foot drop, chronic  Data Reviewed: I have personally reviewed following labs and imaging studies  CBC: Recent Labs  Lab 12/08/22 1702 12/08/22 1756 12/09/22 0047 12/09/22 0459 12/11/22 0259  WBC 7.4  --   --  7.6 5.4  NEUTROABS 4.5  --   --   --   --   HGB 10.7* 12.6 11.6* 10.3* 9.2*  HCT 37.0 37.0 34.0* 34.4* 29.9*  MCV 95.4  --   --  93.5 90.9  PLT 231  --   --  212 226    Basic Metabolic Panel: Recent Labs  Lab 12/08/22 1702 12/08/22 1756 12/09/22 0047 12/09/22 0459 12/11/22 0259  NA 139 140 139 137 135  K  4.2 4.3 4.0 4.2 3.7  CL 98  --   --  104 100  CO2 22  --   --  22 24  GLUCOSE 160*  --   --  143* 126*  BUN 21  --   --  21 24*  CREATININE 1.25*  --   --  1.26* 1.12*  CALCIUM 10.0  --   --  9.3 8.9    GFR: Estimated Creatinine Clearance: 71.1 mL/min (A) (by C-G formula based on SCr of 1.12 mg/dL (H)).  Liver Function Tests: Recent Labs  Lab 12/08/22 1702 12/09/22 0459  AST 27 21  ALT 27 21  ALKPHOS 96 83  BILITOT 0.5 0.7  PROT 8.1 7.6  ALBUMIN 2.9* 2.6*    CBG: Recent Labs  Lab 12/11/22 1953 12/11/22 2312 12/12/22 0313 12/12/22 0731 12/12/22 1235  GLUCAP 139* 130* 125* 116* 162*     Recent Results (from the past 240 hour(s))  Blood Culture (routine x 2)     Status: None (Preliminary result)   Collection Time: 12/08/22  4:52 PM   Specimen: BLOOD  Result Value Ref Range Status   Specimen Description BLOOD SITE NOT SPECIFIED  Final   Special Requests   Final    BOTTLES DRAWN AEROBIC AND ANAEROBIC Blood Culture adequate volume   Culture   Final    NO GROWTH 4 DAYS Performed at [facility], [address] [place], [place] [address]    Report Status PENDING  Incomplete  Blood Culture (routine x 2)     Status: Abnormal (Preliminary result)   Collection Time: 12/08/22  5:06 PM   Specimen: BLOOD  Result Value Ref Range Status   Specimen Description BLOOD LEFT ANTECUBITAL  Final   Special Requests   Final    BOTTLES DRAWN AEROBIC AND ANAEROBIC Blood Culture adequate volume   Culture  Setup Time   Final    GRAM POSITIVE COCCI IN BOTH AEROBIC AND ANAEROBIC BOTTLES CRITICAL RESULT CALLED TO, READ BACK BY AND VERIFIED WITH: PHARMD ERIN WILLIAMSON ON 12/09/22 @ 1948 BY DRT    Culture (A)  Final    STAPHYLOCOCCUS EPIDERMIDIS THE SIGNIFICANCE OF ISOLATING THIS ORGANISM FROM A SINGLE SET OF BLOOD CULTURES WHEN MULTIPLE SETS ARE DRAWN IS UNCERTAIN. PLEASE NOTIFY THE MICROBIOLOGY DEPARTMENT WITHIN ONE WEEK IF SPECIATION AND SENSITIVITIES ARE REQUIRED. Performed  at [facility], [address] [place], [place] [address]    Report Status PENDING  Incomplete  Blood Culture ID Panel (Reflexed)     Status: Abnormal   Collection Time: 12/08/22  5:06 PM  Result Value Ref Range Status   Enterococcus faecalis NOT DETECTED NOT DETECTED Final   Enterococcus Faecium NOT DETECTED NOT DETECTED Final   Listeria monocytogenes NOT DETECTED NOT DETECTED Final  Staphylococcus species DETECTED (A) NOT DETECTED Final    Comment: CRITICAL RESULT CALLED TO, READ BACK BY AND VERIFIED WITH: PHARMD ERIN WILLIAMSON ON 12/09/22 @ 1948 BY DRT    Staphylococcus aureus (BCID) NOT DETECTED NOT DETECTED Final   Staphylococcus epidermidis DETECTED (A) NOT DETECTED Final    Comment: Methicillin (oxacillin) resistant coagulase negative staphylococcus. Possible blood culture contaminant (unless isolated from more than one blood culture draw or clinical case suggests pathogenicity). No antibiotic treatment is indicated for blood  culture contaminants. CRITICAL RESULT CALLED TO, READ BACK BY AND VERIFIED WITH: PHARMD ERIN WILLIAMSON ON 12/09/22 @ 1948 BY DRT    Staphylococcus lugdunensis NOT DETECTED NOT DETECTED Final   Streptococcus species NOT DETECTED NOT DETECTED Final   Streptococcus agalactiae NOT DETECTED NOT DETECTED Final   Streptococcus pneumoniae NOT DETECTED NOT DETECTED Final   Streptococcus pyogenes NOT DETECTED NOT DETECTED Final   A.calcoaceticus-baumannii NOT DETECTED NOT DETECTED Final   Bacteroides fragilis NOT DETECTED NOT DETECTED Final   Enterobacterales NOT DETECTED NOT DETECTED Final   Enterobacter cloacae complex NOT DETECTED NOT DETECTED Final   Escherichia coli NOT DETECTED NOT DETECTED Final   Klebsiella aerogenes NOT DETECTED NOT DETECTED Final   Klebsiella oxytoca NOT DETECTED NOT DETECTED Final   Klebsiella pneumoniae NOT DETECTED NOT DETECTED Final   Proteus species NOT DETECTED NOT DETECTED Final   Salmonella species NOT DETECTED NOT  DETECTED Final   Serratia marcescens NOT DETECTED NOT DETECTED Final   Haemophilus influenzae NOT DETECTED NOT DETECTED Final   Neisseria meningitidis NOT DETECTED NOT DETECTED Final   Pseudomonas aeruginosa NOT DETECTED NOT DETECTED Final   Stenotrophomonas maltophilia NOT DETECTED NOT DETECTED Final   Candida albicans NOT DETECTED NOT DETECTED Final   Candida auris NOT DETECTED NOT DETECTED Final   Candida glabrata NOT DETECTED NOT DETECTED Final   Candida krusei NOT DETECTED NOT DETECTED Final   Candida parapsilosis NOT DETECTED NOT DETECTED Final   Candida tropicalis NOT DETECTED NOT DETECTED Final   Cryptococcus neoformans/gattii NOT DETECTED NOT DETECTED Final   Methicillin resistance mecA/C DETECTED (A) NOT DETECTED Final    Comment: CRITICAL RESULT CALLED TO, READ BACK BY AND VERIFIED WITH: PHARMD ERIN WILLIAMSON ON 12/09/22 @ 1948 BY DRT Performed at [facility], [address], [place], [place] [address]   Resp panel by RT-PCR (RSV, Flu A&B, Covid) Anterior Nasal Swab     Status: None   Collection Time: 12/08/22  5:10 PM   Specimen: Anterior Nasal Swab  Result Value Ref Range Status   SARS Coronavirus 2 by RT PCR NEGATIVE NEGATIVE Final   Influenza A by PCR NEGATIVE NEGATIVE Final   Influenza B by PCR NEGATIVE NEGATIVE Final    Comment: (NOTE) The Xpert Xpress SARS-CoV-2/FLU/RSV plus assay is intended as an aid in the diagnosis of influenza from Nasopharyngeal swab specimens and should not be used as a sole basis for treatment. Nasal washings and aspirates are unacceptable for Xpert Xpress SARS-CoV-2/FLU/RSV testing.  Fact Sheet for Patients: BloggerCourse.com  Fact Sheet for Healthcare Providers: SeriousBroker.it  This test is not yet approved or cleared by the [place] FDA and has been authorized for detection and/or diagnosis of SARS-CoV-2 by FDA under an Emergency Use Authorization (EUA). This EUA will  remain in effect (meaning this test can be used) for the duration of the COVID-19 declaration under Section 564(b)(1) of the Act, 21 U.S.C. section 360bbb-3(b)(1), unless the authorization is terminated or revoked.     Resp Syncytial  Virus by PCR NEGATIVE NEGATIVE Final    Comment: (NOTE) Fact Sheet for Patients: BloggerCourse.com  Fact Sheet for Healthcare Providers: SeriousBroker.it  This test is not yet approved or cleared by the [place] FDA and has been authorized for detection and/or diagnosis of SARS-CoV-2 by FDA under an Emergency Use Authorization (EUA). This EUA will remain in effect (meaning this test can be used) for the duration of the COVID-19 declaration under Section 564(b)(1) of the Act, 21 U.S.C. section 360bbb-3(b)(1), unless the authorization is terminated or revoked.  Performed at [facility], [address], [place], [place] [address]   Urine Culture (for pregnant, neutropenic or urologic patients or patients with an indwelling urinary catheter)     Status: Abnormal   Collection Time: 12/09/22  9:27 AM   Specimen: Urine, Clean Catch  Result Value Ref Range Status   Specimen Description URINE, CLEAN CATCH  Final   Special Requests   Final    NONE Performed at [facility], [address] [place], [place] [address]    Culture (A)  Final    60,000 COLONIES/mL ESCHERICHIA COLI Confirmed Extended Spectrum Beta-Lactamase Producer (ESBL).  In bloodstream infections from ESBL organisms, carbapenems are preferred over piperacillin/tazobactam. They are shown to have a lower risk of mortality.    Report Status 12/11/2022 FINAL  Final   Organism ID, Bacteria ESCHERICHIA COLI (A)  Final      Susceptibility   Escherichia coli - MIC*    AMPICILLIN >=32 RESISTANT Resistant     CEFAZOLIN >=64 RESISTANT Resistant     CEFEPIME >=32 RESISTANT Resistant     CEFTRIAXONE >=64 RESISTANT Resistant      CIPROFLOXACIN >=4 RESISTANT Resistant     GENTAMICIN <=1 SENSITIVE Sensitive     IMIPENEM <=0.25 SENSITIVE Sensitive     NITROFURANTOIN <=16 SENSITIVE Sensitive     TRIMETH/SULFA 40 SENSITIVE Sensitive     AMPICILLIN/SULBACTAM >=32 RESISTANT Resistant     PIP/TAZO 8 SENSITIVE Sensitive     * 60,000 COLONIES/mL ESCHERICHIA COLI         Radiology Studies: No results found.      Scheduled Meds:  aspirin  81 mg Oral Daily   atorvastatin  10 mg Oral QHS   budesonide  0.5 mg Nebulization q12n4p   calcium carbonate  2 tablet Oral Daily   cholecalciferol  2,000 Units Oral Daily   diclofenac Sodium  4 g Topical Daily   DULoxetine  60 mg Oral Daily   enoxaparin (LOVENOX) injection  80 mg Subcutaneous Q24H   famotidine  20 mg Oral Daily   ferrous sulfate  325 mg Oral Q breakfast   fluticasone  1 spray Each Nare Daily   folic acid  1 mg Oral Daily   gabapentin  400 mg Oral BID   insulin aspart  0-9 Units Subcutaneous Q4H   methotrexate  5 mg Oral Weekly   montelukast  10 mg Oral Daily   oxyCODONE  5 mg Oral BID   polyethylene glycol  17 g Oral Daily   senna-docusate  2 tablet Oral Daily   topiramate  25 mg Oral BID   Continuous Infusions:  meropenem (MERREM) IV 1 g (12/12/22 1320)     LOS: 3 days     Huey Bienenstock, MD [facility]   To contact the attending provider between 7A-7P or the covering provider during after hours 7P-7A, please log into the web site [email or web address] and access using universal [place] password for  that web site. If you do not have the password, please call the hospital operator.  12/12/2022, 2:30 PM

## 2022-12-13 DIAGNOSIS — R6520 Severe sepsis without septic shock: Secondary | ICD-10-CM | POA: Diagnosis not present

## 2022-12-13 DIAGNOSIS — A419 Sepsis, unspecified organism: Secondary | ICD-10-CM | POA: Diagnosis not present

## 2022-12-13 DIAGNOSIS — N390 Urinary tract infection, site not specified: Secondary | ICD-10-CM | POA: Diagnosis not present

## 2022-12-13 DIAGNOSIS — G9341 Metabolic encephalopathy: Secondary | ICD-10-CM | POA: Diagnosis not present

## 2022-12-13 LAB — GLUCOSE, CAPILLARY
Glucose-Capillary: 101 mg/dL — ABNORMAL HIGH (ref 70–99)
Glucose-Capillary: 112 mg/dL — ABNORMAL HIGH (ref 70–99)
Glucose-Capillary: 115 mg/dL — ABNORMAL HIGH (ref 70–99)
Glucose-Capillary: 121 mg/dL — ABNORMAL HIGH (ref 70–99)
Glucose-Capillary: 129 mg/dL — ABNORMAL HIGH (ref 70–99)
Glucose-Capillary: 136 mg/dL — ABNORMAL HIGH (ref 70–99)

## 2022-12-13 NOTE — Progress Notes (Signed)
OT Cancellation Note  Patient Details Name: Michelle Gay MRN: [id number] DOB: 1950/07/12   Cancelled Treatment:    Reason Eval/Treat Not Completed: OT screened, no needs identified, will sign off Pt reporting she feels at her baseline. Reports she is dependent for all ADL, including feeding, at baseline. Reports she is dependent in mobility at baseline as well. Pt noted with slow processing, requiring repeated questions before she answered but would answer questions appropriately. Anticipate cognition to return to baseline as infection resolves. OT will sign off. Should pt have a change in status, please re-consult. Thank you for referral.   Rebeca Alert 12/13/2022, 12:17 PM

## 2022-12-13 NOTE — Progress Notes (Signed)
PT Cancellation Note  Patient Details Name: Michelle Gay MRN: [id number] DOB: [birth year]   Cancelled Treatment:    Reason Eval/Treat Not Completed: PT screened, no needs identified, will sign off. Pt is dependent for bed mobility and staff utilize a hoyer lift to assist in transfers. Pt is unable to recall the last time she received therapy services but she reports it was at least 6 months ago, when she reports she was performing bed-level exercise. Pt appears to have no acute PT needs at this time as she is dependent for all functional mobility. Acute PT signing off.   Arlyss Gandy 12/13/2022, 10:31 AM

## 2022-12-13 NOTE — Progress Notes (Signed)
PROGRESS NOTE    Michelle Gay  VFI:[id number] DOB: 04/12/1951 DOA: 12/08/2022 PCP: Justin Mend, MD    Chief Complaint  Patient presents with   Altered Mental Status    Brief Narrative:    Michelle Gay is a 72 y.o. female with medical history significant of COPD, DM2, HTN, MCI, morbid obesity. She presents to ED from facility with AMS, N/V and lower abd pain over past few days.  Increased urinary frequency.Pt satting 97% on RA when we can get a wave form.  - Of note, pt with very similar presentation with AMS due to UTI back in May.  Assessment & Plan:   Principal Problem:   Sepsis with acute organ dysfunction (HCC) Active Problems:   UTI (urinary tract infection)   COPD (chronic obstructive pulmonary disease) (HCC)   Acute encephalopathy   Decubitus ulcer of buttock, stage 2 (HCC)   DM2 (diabetes mellitus, type 2) (HCC)   HTN (hypertension)   Morbid obesity with BMI of 60.0-69.9, adult (HCC)   Severe sepsis (HCC)   Sepsis with acute organ dysfunction (HCC) -She presents, with tachypnea, tachycardia and altered mental status -Culture growing ESBL, initially on Rocephin, has been changed to meropenem, will need to finish total 5 days, today is day 3 out of 5 -1 out of 4 blood culture growing Staphylococcus epidermidis, most likely contaminant, continue to monitor for final sensitivity  Decubitus ulcer of buttock, stage 2 (HCC) Bilateral stage 2 decubitus ulcers of buttocks, ~1cm diameter in each case.  No surrounding erythema, exudate, crepitus, etc to suggest active infection of these ulcers. -New with wound care   Acute encephalopathy Likely delirium secondary to UTI with sepsis.   - will decrease Gabapentin -Mentation much improved  COPD (chronic obstructive pulmonary disease) (HCC) No active wheezing for now, continue to monitor    Morbid obesity with BMI of 60.0-69.9, adult (HCC) Will hold ozempic for the moment.   HTN (hypertension) Hold home  BP meds given initial soft BPs with sepsis.  Remains soft today, continue to hold   DM2 (diabetes mellitus, type 2) (HCC) Hold Ozempic SSI sensitive Q4H for the moment  Rheumatoid arthritis - continue with methotrexate        DVT prophylaxis: Lovenox Code Status: Full Family Communication: None at bedside, tried to reach niece by phone with no availability. Disposition:   Status is: Inpatient    Consultants:  none   Subjective:  No significant events overnight, she denies any complaints today  Objective: Vitals:   12/13/22 0328 12/13/22 0940 12/13/22 1000 12/13/22 1200  BP: 101/71 122/68  97/69  Pulse: 81 83 84 97  Resp: 17  17 18   Temp: 98.5 F (36.9 C) 98.6 F (37 C)  (!) 97.3 F (36.3 C)  TempSrc: Oral Oral  Oral  SpO2: 98%   98%  Weight:      Height:       No intake or output data in the 24 hours ending 12/13/22 1446  Filed Weights   12/08/22 1650  Weight: (!) 158.8 kg    Examination:  Awake Alert, B-, pleasant, answers some questions, follows some commands, impaired judgment and insight Symmetrical Chest wall movement, Good air movement bilaterally, CTAB RRR,No Gallops,Rubs or new Murmurs, No Parasternal Heave +ve B.Sounds, Abd Soft, No tenderness, No rebound - guarding or rigidity. Trace edema, deconditioned with gross weakness at baseline    Data Reviewed: I have personally reviewed following labs and imaging studies  CBC: Recent Labs  Lab  12/08/22 1702 12/08/22 1756 12/09/22 0047 12/09/22 0459 12/11/22 0259  WBC 7.4  --   --  7.6 5.4  NEUTROABS 4.5  --   --   --   --   HGB 10.7* 12.6 11.6* 10.3* 9.2*  HCT 37.0 37.0 34.0* 34.4* 29.9*  MCV 95.4  --   --  93.5 90.9  PLT 231  --   --  212 226    Basic Metabolic Panel: Recent Labs  Lab 12/08/22 1702 12/08/22 1756 12/09/22 0047 12/09/22 0459 12/11/22 0259  NA 139 140 139 137 135  K 4.2 4.3 4.0 4.2 3.7  CL 98  --   --  104 100  CO2 22  --   --  22 24  GLUCOSE 160*  --   --   143* 126*  BUN 21  --   --  21 24*  CREATININE 1.25*  --   --  1.26* 1.12*  CALCIUM 10.0  --   --  9.3 8.9    GFR: Estimated Creatinine Clearance: 71.1 mL/min (A) (by C-G formula based on SCr of 1.12 mg/dL (H)).  Liver Function Tests: Recent Labs  Lab 12/08/22 1702 12/09/22 0459  AST 27 21  ALT 27 21  ALKPHOS 96 83  BILITOT 0.5 0.7  PROT 8.1 7.6  ALBUMIN 2.9* 2.6*    CBG: Recent Labs  Lab 12/12/22 1956 12/12/22 2331 12/13/22 0329 12/13/22 0930 12/13/22 1254  GLUCAP 136* 111* 112* 115* 136*     Recent Results (from the past 240 hour(s))  Blood Culture (routine x 2)     Status: None   Collection Time: 12/08/22  4:52 PM   Specimen: BLOOD  Result Value Ref Range Status   Specimen Description BLOOD SITE NOT SPECIFIED  Final   Special Requests   Final    BOTTLES DRAWN AEROBIC AND ANAEROBIC Blood Culture adequate volume   Culture   Final    NO GROWTH 5 DAYS Performed at [facility], [address] [place], [place] [address]    Report Status 12/13/2022 FINAL  Final  Blood Culture (routine x 2)     Status: Abnormal   Collection Time: 12/08/22  5:06 PM   Specimen: BLOOD  Result Value Ref Range Status   Specimen Description BLOOD LEFT ANTECUBITAL  Final   Special Requests   Final    BOTTLES DRAWN AEROBIC AND ANAEROBIC Blood Culture adequate volume   Culture  Setup Time   Final    GRAM POSITIVE COCCI IN BOTH AEROBIC AND ANAEROBIC BOTTLES CRITICAL RESULT CALLED TO, READ BACK BY AND VERIFIED WITH: PHARMD ERIN WILLIAMSON ON 12/09/22 @ 1948 BY DRT    Culture (A)  Final    STAPHYLOCOCCUS EPIDERMIDIS THE SIGNIFICANCE OF ISOLATING THIS ORGANISM FROM A SINGLE SET OF BLOOD CULTURES WHEN MULTIPLE SETS ARE DRAWN IS UNCERTAIN. PLEASE NOTIFY THE MICROBIOLOGY DEPARTMENT WITHIN ONE WEEK IF SPECIATION AND SENSITIVITIES ARE REQUIRED. Performed at [facility], [address] [place], [place] [address]    Report Status 12/13/2022 FINAL  Final  Blood Culture ID Panel  (Reflexed)     Status: Abnormal   Collection Time: 12/08/22  5:06 PM  Result Value Ref Range Status   Enterococcus faecalis NOT DETECTED NOT DETECTED Final   Enterococcus Faecium NOT DETECTED NOT DETECTED Final   Listeria monocytogenes NOT DETECTED NOT DETECTED Final   Staphylococcus species DETECTED (A) NOT DETECTED Final    Comment: CRITICAL RESULT CALLED TO, READ BACK BY AND  VERIFIED WITH: PHARMD ERIN WILLIAMSON ON 12/09/22 @ 1948 BY DRT    Staphylococcus aureus (BCID) NOT DETECTED NOT DETECTED Final   Staphylococcus epidermidis DETECTED (A) NOT DETECTED Final    Comment: Methicillin (oxacillin) resistant coagulase negative staphylococcus. Possible blood culture contaminant (unless isolated from more than one blood culture draw or clinical case suggests pathogenicity). No antibiotic treatment is indicated for blood  culture contaminants. CRITICAL RESULT CALLED TO, READ BACK BY AND VERIFIED WITH: PHARMD ERIN WILLIAMSON ON 12/09/22 @ 1948 BY DRT    Staphylococcus lugdunensis NOT DETECTED NOT DETECTED Final   Streptococcus species NOT DETECTED NOT DETECTED Final   Streptococcus agalactiae NOT DETECTED NOT DETECTED Final   Streptococcus pneumoniae NOT DETECTED NOT DETECTED Final   Streptococcus pyogenes NOT DETECTED NOT DETECTED Final   A.calcoaceticus-baumannii NOT DETECTED NOT DETECTED Final   Bacteroides fragilis NOT DETECTED NOT DETECTED Final   Enterobacterales NOT DETECTED NOT DETECTED Final   Enterobacter cloacae complex NOT DETECTED NOT DETECTED Final   Escherichia coli NOT DETECTED NOT DETECTED Final   Klebsiella aerogenes NOT DETECTED NOT DETECTED Final   Klebsiella oxytoca NOT DETECTED NOT DETECTED Final   Klebsiella pneumoniae NOT DETECTED NOT DETECTED Final   Proteus species NOT DETECTED NOT DETECTED Final   Salmonella species NOT DETECTED NOT DETECTED Final   Serratia marcescens NOT DETECTED NOT DETECTED Final   Haemophilus influenzae NOT DETECTED NOT DETECTED Final    Neisseria meningitidis NOT DETECTED NOT DETECTED Final   Pseudomonas aeruginosa NOT DETECTED NOT DETECTED Final   Stenotrophomonas maltophilia NOT DETECTED NOT DETECTED Final   Candida albicans NOT DETECTED NOT DETECTED Final   Candida auris NOT DETECTED NOT DETECTED Final   Candida glabrata NOT DETECTED NOT DETECTED Final   Candida krusei NOT DETECTED NOT DETECTED Final   Candida parapsilosis NOT DETECTED NOT DETECTED Final   Candida tropicalis NOT DETECTED NOT DETECTED Final   Cryptococcus neoformans/gattii NOT DETECTED NOT DETECTED Final   Methicillin resistance mecA/C DETECTED (A) NOT DETECTED Final    Comment: CRITICAL RESULT CALLED TO, READ BACK BY AND VERIFIED WITH: PHARMD ERIN WILLIAMSON ON 12/09/22 @ 1948 BY DRT Performed at [facility], [address], [place], [place] [address]   Resp panel by RT-PCR (RSV, Flu A&B, Covid) Anterior Nasal Swab     Status: None   Collection Time: 12/08/22  5:10 PM   Specimen: Anterior Nasal Swab  Result Value Ref Range Status   SARS Coronavirus 2 by RT PCR NEGATIVE NEGATIVE Final   Influenza A by PCR NEGATIVE NEGATIVE Final   Influenza B by PCR NEGATIVE NEGATIVE Final    Comment: (NOTE) The Xpert Xpress SARS-CoV-2/FLU/RSV plus assay is intended as an aid in the diagnosis of influenza from Nasopharyngeal swab specimens and should not be used as a sole basis for treatment. Nasal washings and aspirates are unacceptable for Xpert Xpress SARS-CoV-2/FLU/RSV testing.  Fact Sheet for Patients: BloggerCourse.com  Fact Sheet for Healthcare Providers: SeriousBroker.it  This test is not yet approved or cleared by the [place] FDA and has been authorized for detection and/or diagnosis of SARS-CoV-2 by FDA under an Emergency Use Authorization (EUA). This EUA will remain in effect (meaning this test can be used) for the duration of the COVID-19 declaration under Section 564(b)(1) of the Act,  21 U.S.C. section 360bbb-3(b)(1), unless the authorization is terminated or revoked.     Resp Syncytial Virus by PCR NEGATIVE NEGATIVE Final    Comment: (NOTE) Fact Sheet for Patients: BloggerCourse.com  Fact Sheet  for Healthcare Providers: SeriousBroker.it  This test is not yet approved or cleared by the [place] and has been authorized for detection and/or diagnosis of SARS-CoV-2 by FDA under an Emergency Use Authorization (EUA). This EUA will remain in effect (meaning this test can be used) for the duration of the COVID-19 declaration under Section 564(b)(1) of the Act, 21 U.S.C. section 360bbb-3(b)(1), unless the authorization is terminated or revoked.  Performed at [facility], [address] [place], [place] [address]   Urine Culture (for pregnant, neutropenic or urologic patients or patients with an indwelling urinary catheter)     Status: Abnormal   Collection Time: 12/09/22  9:27 AM   Specimen: Urine, Clean Catch  Result Value Ref Range Status   Specimen Description URINE, CLEAN CATCH  Final   Special Requests   Final    NONE Performed at [facility], [address] [place], [place] [address]    Culture (A)  Final    60,000 COLONIES/mL ESCHERICHIA COLI Confirmed Extended Spectrum Beta-Lactamase Producer (ESBL).  In bloodstream infections from ESBL organisms, carbapenems are preferred over piperacillin/tazobactam. They are shown to have a lower risk of mortality.    Report Status 12/11/2022 FINAL  Final   Organism ID, Bacteria ESCHERICHIA COLI (A)  Final      Susceptibility   Escherichia coli - MIC*    AMPICILLIN >=32 RESISTANT Resistant     CEFAZOLIN >=64 RESISTANT Resistant     CEFEPIME >=32 RESISTANT Resistant     CEFTRIAXONE >=64 RESISTANT Resistant     CIPROFLOXACIN >=4 RESISTANT Resistant     GENTAMICIN <=1 SENSITIVE Sensitive     IMIPENEM <=0.25 SENSITIVE Sensitive      NITROFURANTOIN <=16 SENSITIVE Sensitive     TRIMETH/SULFA 40 SENSITIVE Sensitive     AMPICILLIN/SULBACTAM >=32 RESISTANT Resistant     PIP/TAZO 8 SENSITIVE Sensitive     * 60,000 COLONIES/mL ESCHERICHIA COLI         Radiology Studies: No results found.      Scheduled Meds:  aspirin  81 mg Oral Daily   atorvastatin  10 mg Oral QHS   budesonide  0.5 mg Nebulization q12n4p   calcium carbonate  2 tablet Oral Daily   cholecalciferol  2,000 Units Oral Daily   diclofenac Sodium  4 g Topical Daily   DULoxetine  60 mg Oral Daily   enoxaparin (LOVENOX) injection  80 mg Subcutaneous Q24H   famotidine  20 mg Oral Daily   ferrous sulfate  325 mg Oral Q breakfast   fluticasone  1 spray Each Nare Daily   folic acid  1 mg Oral Daily   gabapentin  400 mg Oral BID   insulin aspart  0-9 Units Subcutaneous Q4H   methotrexate  5 mg Oral Weekly   montelukast  10 mg Oral Daily   oxyCODONE  5 mg Oral BID   polyethylene glycol  17 g Oral Daily   senna-docusate  2 tablet Oral Daily   topiramate  25 mg Oral BID   Continuous Infusions:  meropenem (MERREM) IV 1 g (12/13/22 1408)     LOS: 4 days     Huey Bienenstock, MD [facility]   To contact the attending provider between 7A-7P or the covering provider during after hours 7P-7A, please log into the web site [email or web address] and access using universal [place] password for that web site. If you do not have the password, please call the hospital operator.  12/13/2022, 2:46 PM

## 2022-12-14 DIAGNOSIS — A419 Sepsis, unspecified organism: Secondary | ICD-10-CM | POA: Diagnosis not present

## 2022-12-14 DIAGNOSIS — R6520 Severe sepsis without septic shock: Secondary | ICD-10-CM | POA: Diagnosis not present

## 2022-12-14 DIAGNOSIS — G9341 Metabolic encephalopathy: Secondary | ICD-10-CM | POA: Diagnosis not present

## 2022-12-14 LAB — GLUCOSE, CAPILLARY
Glucose-Capillary: 103 mg/dL — ABNORMAL HIGH (ref 70–99)
Glucose-Capillary: 111 mg/dL — ABNORMAL HIGH (ref 70–99)
Glucose-Capillary: 114 mg/dL — ABNORMAL HIGH (ref 70–99)
Glucose-Capillary: 120 mg/dL — ABNORMAL HIGH (ref 70–99)
Glucose-Capillary: 124 mg/dL — ABNORMAL HIGH (ref 70–99)
Glucose-Capillary: 137 mg/dL — ABNORMAL HIGH (ref 70–99)

## 2022-12-14 NOTE — Progress Notes (Signed)
Dear Doctor: Randol Kern,  This patient has been identified as a candidate for PICC for the following reason (s): poor veins/poor circulatory system (CHF, COPD, emphysema, diabetes, steroid use, IV drug abuse, etc.) If you agree, please write an order for the indicated device. For any questions contact the Vascular Access Team at [phone] if no answer, please leave a message.  Thank you for supporting the early vascular access assessment program.

## 2022-12-14 NOTE — Progress Notes (Signed)
Attempted PIV access x 2 with no success. Very poor vasculature. Vessels are deep and bifurcate. Recommendations for a CL or PICC line.

## 2022-12-14 NOTE — Progress Notes (Signed)
Pharmacy Antibiotic Note  Michelle Gay is a 72 y.o. female admitted on 12/08/2022 with sepsis 2/2 presumed UTI. She presented to the ED with AMS, n/v, lower abdominal pain and increased urinary frequency. She was initiated on CTX and flagyl for symptomatic UTI. Ucx grew 60,000 colonies of ESBL E. Coli. Pharmacy has been consulted for meropenem dosing.   Patient remains afebrile. WBC wnl, Scr 1.12 (down from 1.26, BL < 1) from 12/11/22 labs.  Plan: Continue Meropenem 1g Q8h IV (day 3/5) Monitor renal function, clinical progression and culture results for de-escalation  Height: 5\' 4"  (162.6 cm) Weight: (!) 158.8 kg (350 lb) (pt unable to answer) IBW/kg (Calculated) : 54.7  Temp (24hrs), Avg:98.1 F (36.7 C), Min:98 F (36.7 C), Max:98.4 F (36.9 C)  Recent Labs  Lab 12/08/22 1702 12/08/22 1757 12/09/22 0129 12/09/22 0459 12/11/22 0259  WBC 7.4  --   --  7.6 5.4  CREATININE 1.25*  --   --  1.26* 1.12*  LATICACIDVEN  --  1.7 1.0 1.4  --     Estimated Creatinine Clearance: 71.1 mL/min (A) (by C-G formula based on SCr of 1.12 mg/dL (H)).    No Known Allergies  Antimicrobials this admission: CTX 7/22 >> 7/25 Flagyl 7/22 >>  Meropenem 7/25 >> (7/30)  Microbiology results: 7/22 BCx: staph epi in 1/4 vials - likely contaminant 7/23 UCx: ESBL E. Coli  7/22 Flu/COVID negative  Thank you for allowing pharmacy to be a part of this patient's care.  Roslyn Smiling, PharmD PGY1 Pharmacy Resident 12/14/2022 12:43 PM

## 2022-12-14 NOTE — Plan of Care (Signed)
  Problem: Nutritional: Goal: Maintenance of adequate nutrition will improve Outcome: Progressing   Problem: Skin Integrity: Goal: Risk for impaired skin integrity will decrease Outcome: Progressing   Problem: Activity: Goal: Risk for activity intolerance will decrease Outcome: Progressing   Problem: Pain Managment: Goal: General experience of comfort will improve Outcome: Progressing   Problem: Skin Integrity: Goal: Risk for impaired skin integrity will decrease Outcome: Progressing

## 2022-12-14 NOTE — Progress Notes (Signed)
PROGRESS NOTE    Michelle Gay  MVH:[id number] DOB: Feb 11, 1951 DOA: 12/08/2022 PCP: Justin Mend, MD    Chief Complaint  Patient presents with   Altered Mental Status    Brief Narrative:    Michelle Gay is a 72 y.o. female with medical history significant of COPD, DM2, HTN, MCI, morbid obesity. She presents to ED from facility with AMS, N/V and lower abd pain over past few days.  Increased urinary frequency.Pt satting 97% on RA when we can get a wave form.  - Of note, pt with very similar presentation with AMS due to UTI back in May.  Assessment & Plan:   Principal Problem:   Sepsis with acute organ dysfunction (HCC) Active Problems:   UTI (urinary tract infection)   COPD (chronic obstructive pulmonary disease) (HCC)   Acute encephalopathy   Decubitus ulcer of buttock, stage 2 (HCC)   DM2 (diabetes mellitus, type 2) (HCC)   HTN (hypertension)   Morbid obesity with BMI of 60.0-69.9, adult (HCC)   Severe sepsis (HCC)   Sepsis with acute organ dysfunction (HCC) -She presents, with tachypnea, tachycardia and altered mental status -Culture growing ESBL, initially on Rocephin, has been changed to meropenem, will need to finish total 5 days of IV meropenem, start date 7/25, stop date 7/29.   -1 out of 4 blood culture growing Staphylococcus epidermidis, most likely contaminant, continue to monitor for final sensitivity  Decubitus ulcer of buttock, stage 2 (HCC) Bilateral stage 2 decubitus ulcers of buttocks, ~1cm diameter in each case.  No surrounding erythema, exudate, crepitus, etc to suggest active infection of these ulcers. -New with wound care   Acute encephalopathy Likely delirium secondary to UTI with sepsis.   - will decrease Gabapentin -Mentation much improved  COPD (chronic obstructive pulmonary disease) (HCC) No active wheezing for now, continue to monitor    Morbid obesity with BMI of 60.0-69.9, adult (HCC) Will hold ozempic for the moment.   HTN  (hypertension) Hold home BP meds given initial soft BPs with sepsis.  Remains soft today, continue to hold   DM2 (diabetes mellitus, type 2) (HCC) Hold Ozempic SSI sensitive Q4H for the moment  Rheumatoid arthritis - continue with methotrexate        DVT prophylaxis: Lovenox Code Status: Full Family Communication: None at bedside, tried to reach niece by phone with no availability. Disposition:   Status is: Inpatient    Consultants:  none   Subjective:  No significant events overnight, she denies any complaints today  Objective: Vitals:   12/14/22 0758 12/14/22 0800 12/14/22 0937 12/14/22 1131  BP:  (!) 101/55 (!) 114/97 130/61  Pulse:  76 88   Resp:  14 15   Temp: 98 F (36.7 C)   98.4 F (36.9 C)  TempSrc:      SpO2:  97% 99%   Weight:      Height:        Intake/Output Summary (Last 24 hours) at 12/14/2022 1431 Last data filed at 12/14/2022 1100 Gross per 24 hour  Intake 960 ml  Output 400 ml  Net 560 ml    Filed Weights   12/08/22 1650  Weight: (!) 158.8 kg    Examination:  Awake Alert, pleasant, answers some questions, follows some commands, impaired judgment and insight Symmetrical Chest wall movement, Good air movement bilaterally, CTAB RRR,No Gallops,Rubs or new Murmurs, No Parasternal Heave +ve B.Sounds, Abd Soft, No tenderness, No rebound - guarding or rigidity. Trace edema, deconditioned with gross weakness  at baseline    Data Reviewed: I have personally reviewed following labs and imaging studies  CBC: Recent Labs  Lab 12/08/22 1702 12/08/22 1756 12/09/22 0047 12/09/22 0459 12/11/22 0259  WBC 7.4  --   --  7.6 5.4  NEUTROABS 4.5  --   --   --   --   HGB 10.7* 12.6 11.6* 10.3* 9.2*  HCT 37.0 37.0 34.0* 34.4* 29.9*  MCV 95.4  --   --  93.5 90.9  PLT 231  --   --  212 226    Basic Metabolic Panel: Recent Labs  Lab 12/08/22 1702 12/08/22 1756 12/09/22 0047 12/09/22 0459 12/11/22 0259  NA 139 140 139 137 135  K 4.2 4.3  4.0 4.2 3.7  CL 98  --   --  104 100  CO2 22  --   --  22 24  GLUCOSE 160*  --   --  143* 126*  BUN 21  --   --  21 24*  CREATININE 1.25*  --   --  1.26* 1.12*  CALCIUM 10.0  --   --  9.3 8.9    GFR: Estimated Creatinine Clearance: 71.1 mL/min (A) (by C-G formula based on SCr of 1.12 mg/dL (H)).  Liver Function Tests: Recent Labs  Lab 12/08/22 1702 12/09/22 0459  AST 27 21  ALT 27 21  ALKPHOS 96 83  BILITOT 0.5 0.7  PROT 8.1 7.6  ALBUMIN 2.9* 2.6*    CBG: Recent Labs  Lab 12/13/22 2111 12/13/22 2330 12/14/22 0325 12/14/22 0833 12/14/22 1130  GLUCAP 129* 121* 120* 114* 124*     Recent Results (from the past 240 hour(s))  Blood Culture (routine x 2)     Status: None   Collection Time: 12/08/22  4:52 PM   Specimen: BLOOD  Result Value Ref Range Status   Specimen Description BLOOD SITE NOT SPECIFIED  Final   Special Requests   Final    BOTTLES DRAWN AEROBIC AND ANAEROBIC Blood Culture adequate volume   Culture   Final    NO GROWTH 5 DAYS Performed at [facility], [address], [place], [place] [address]    Report Status 12/13/2022 FINAL  Final  Blood Culture (routine x 2)     Status: Abnormal   Collection Time: 12/08/22  5:06 PM   Specimen: BLOOD  Result Value Ref Range Status   Specimen Description BLOOD LEFT ANTECUBITAL  Final   Special Requests   Final    BOTTLES DRAWN AEROBIC AND ANAEROBIC Blood Culture adequate volume   Culture  Setup Time   Final    GRAM POSITIVE COCCI IN BOTH AEROBIC AND ANAEROBIC BOTTLES CRITICAL RESULT CALLED TO, READ BACK BY AND VERIFIED WITH: PHARMD ERIN WILLIAMSON ON 12/09/22 @ 1948 BY DRT    Culture (A)  Final    STAPHYLOCOCCUS EPIDERMIDIS THE SIGNIFICANCE OF ISOLATING THIS ORGANISM FROM A SINGLE SET OF BLOOD CULTURES WHEN MULTIPLE SETS ARE DRAWN IS UNCERTAIN. PLEASE NOTIFY THE MICROBIOLOGY DEPARTMENT WITHIN ONE WEEK IF SPECIATION AND SENSITIVITIES ARE REQUIRED. Performed at [facility], [address]  [place], [place] [address]    Report Status 12/13/2022 FINAL  Final  Blood Culture ID Panel (Reflexed)     Status: Abnormal   Collection Time: 12/08/22  5:06 PM  Result Value Ref Range Status   Enterococcus faecalis NOT DETECTED NOT DETECTED Final   Enterococcus Faecium NOT DETECTED NOT DETECTED Final   Listeria monocytogenes NOT DETECTED NOT DETECTED  Final   Staphylococcus species DETECTED (A) NOT DETECTED Final    Comment: CRITICAL RESULT CALLED TO, READ BACK BY AND VERIFIED WITH: PHARMD ERIN WILLIAMSON ON 12/09/22 @ 1948 BY DRT    Staphylococcus aureus (BCID) NOT DETECTED NOT DETECTED Final   Staphylococcus epidermidis DETECTED (A) NOT DETECTED Final    Comment: Methicillin (oxacillin) resistant coagulase negative staphylococcus. Possible blood culture contaminant (unless isolated from more than one blood culture draw or clinical case suggests pathogenicity). No antibiotic treatment is indicated for blood  culture contaminants. CRITICAL RESULT CALLED TO, READ BACK BY AND VERIFIED WITH: PHARMD ERIN WILLIAMSON ON 12/09/22 @ 1948 BY DRT    Staphylococcus lugdunensis NOT DETECTED NOT DETECTED Final   Streptococcus species NOT DETECTED NOT DETECTED Final   Streptococcus agalactiae NOT DETECTED NOT DETECTED Final   Streptococcus pneumoniae NOT DETECTED NOT DETECTED Final   Streptococcus pyogenes NOT DETECTED NOT DETECTED Final   A.calcoaceticus-baumannii NOT DETECTED NOT DETECTED Final   Bacteroides fragilis NOT DETECTED NOT DETECTED Final   Enterobacterales NOT DETECTED NOT DETECTED Final   Enterobacter cloacae complex NOT DETECTED NOT DETECTED Final   Escherichia coli NOT DETECTED NOT DETECTED Final   Klebsiella aerogenes NOT DETECTED NOT DETECTED Final   Klebsiella oxytoca NOT DETECTED NOT DETECTED Final   Klebsiella pneumoniae NOT DETECTED NOT DETECTED Final   Proteus species NOT DETECTED NOT DETECTED Final   Salmonella species NOT DETECTED NOT DETECTED Final   Serratia marcescens NOT  DETECTED NOT DETECTED Final   Haemophilus influenzae NOT DETECTED NOT DETECTED Final   Neisseria meningitidis NOT DETECTED NOT DETECTED Final   Pseudomonas aeruginosa NOT DETECTED NOT DETECTED Final   Stenotrophomonas maltophilia NOT DETECTED NOT DETECTED Final   Candida albicans NOT DETECTED NOT DETECTED Final   Candida auris NOT DETECTED NOT DETECTED Final   Candida glabrata NOT DETECTED NOT DETECTED Final   Candida krusei NOT DETECTED NOT DETECTED Final   Candida parapsilosis NOT DETECTED NOT DETECTED Final   Candida tropicalis NOT DETECTED NOT DETECTED Final   Cryptococcus neoformans/gattii NOT DETECTED NOT DETECTED Final   Methicillin resistance mecA/C DETECTED (A) NOT DETECTED Final    Comment: CRITICAL RESULT CALLED TO, READ BACK BY AND VERIFIED WITH: PHARMD ERIN WILLIAMSON ON 12/09/22 @ 1948 BY DRT Performed at [facility], [address] [place], [place] [address]   Resp panel by RT-PCR (RSV, Flu A&B, Covid) Anterior Nasal Swab     Status: None   Collection Time: 12/08/22  5:10 PM   Specimen: Anterior Nasal Swab  Result Value Ref Range Status   SARS Coronavirus 2 by RT PCR NEGATIVE NEGATIVE Final   Influenza A by PCR NEGATIVE NEGATIVE Final   Influenza B by PCR NEGATIVE NEGATIVE Final    Comment: (NOTE) The Xpert Xpress SARS-CoV-2/FLU/RSV plus assay is intended as an aid in the diagnosis of influenza from Nasopharyngeal swab specimens and should not be used as a sole basis for treatment. Nasal washings and aspirates are unacceptable for Xpert Xpress SARS-CoV-2/FLU/RSV testing.  Fact Sheet for Patients: BloggerCourse.com  Fact Sheet for Healthcare Providers: SeriousBroker.it  This test is not yet approved or cleared by the [place] FDA and has been authorized for detection and/or diagnosis of SARS-CoV-2 by FDA under an Emergency Use Authorization (EUA). This EUA will remain in effect (meaning this test can be  used) for the duration of the COVID-19 declaration under Section 564(b)(1) of the Act, 21 U.S.C. section 360bbb-3(b)(1), unless the authorization is terminated or revoked.  Resp Syncytial Virus by PCR NEGATIVE NEGATIVE Final    Comment: (NOTE) Fact Sheet for Patients: BloggerCourse.com  Fact Sheet for Healthcare Providers: SeriousBroker.it  This test is not yet approved or cleared by the [place] FDA and has been authorized for detection and/or diagnosis of SARS-CoV-2 by FDA under an Emergency Use Authorization (EUA). This EUA will remain in effect (meaning this test can be used) for the duration of the COVID-19 declaration under Section 564(b)(1) of the Act, 21 U.S.C. section 360bbb-3(b)(1), unless the authorization is terminated or revoked.  Performed at [facility], [address], [place], [place] [address]   Urine Culture (for pregnant, neutropenic or urologic patients or patients with an indwelling urinary catheter)     Status: Abnormal   Collection Time: 12/09/22  9:27 AM   Specimen: Urine, Clean Catch  Result Value Ref Range Status   Specimen Description URINE, CLEAN CATCH  Final   Special Requests   Final    NONE Performed at [facility], [address] [place], [place] [address]    Culture (A)  Final    60,000 COLONIES/mL ESCHERICHIA COLI Confirmed Extended Spectrum Beta-Lactamase Producer (ESBL).  In bloodstream infections from ESBL organisms, carbapenems are preferred over piperacillin/tazobactam. They are shown to have a lower risk of mortality.    Report Status 12/11/2022 FINAL  Final   Organism ID, Bacteria ESCHERICHIA COLI (A)  Final      Susceptibility   Escherichia coli - MIC*    AMPICILLIN >=32 RESISTANT Resistant     CEFAZOLIN >=64 RESISTANT Resistant     CEFEPIME >=32 RESISTANT Resistant     CEFTRIAXONE >=64 RESISTANT Resistant     CIPROFLOXACIN >=4 RESISTANT Resistant      GENTAMICIN <=1 SENSITIVE Sensitive     IMIPENEM <=0.25 SENSITIVE Sensitive     NITROFURANTOIN <=16 SENSITIVE Sensitive     TRIMETH/SULFA 40 SENSITIVE Sensitive     AMPICILLIN/SULBACTAM >=32 RESISTANT Resistant     PIP/TAZO 8 SENSITIVE Sensitive     * 60,000 COLONIES/mL ESCHERICHIA COLI         Radiology Studies: No results found.      Scheduled Meds:  aspirin  81 mg Oral Daily   atorvastatin  10 mg Oral QHS   budesonide  0.5 mg Nebulization q12n4p   calcium carbonate  2 tablet Oral Daily   cholecalciferol  2,000 Units Oral Daily   diclofenac Sodium  4 g Topical Daily   DULoxetine  60 mg Oral Daily   enoxaparin (LOVENOX) injection  80 mg Subcutaneous Q24H   famotidine  20 mg Oral Daily   ferrous sulfate  325 mg Oral Q breakfast   fluticasone  1 spray Each Nare Daily   folic acid  1 mg Oral Daily   gabapentin  400 mg Oral BID   insulin aspart  0-9 Units Subcutaneous Q4H   methotrexate  5 mg Oral Weekly   montelukast  10 mg Oral Daily   oxyCODONE  5 mg Oral BID   polyethylene glycol  17 g Oral Daily   senna-docusate  2 tablet Oral Daily   topiramate  25 mg Oral BID   Continuous Infusions:  meropenem (MERREM) IV 1 g (12/14/22 0556)     LOS: 5 days     Huey Bienenstock, MD [facility]   To contact the attending provider between 7A-7P or the covering provider during after hours 7P-7A, please log into the web site [email or web address] and access using universal [place]  password for that web site. If you do not have the password, please call the hospital operator.  12/14/2022, 2:31 PM

## 2022-12-15 DIAGNOSIS — G9341 Metabolic encephalopathy: Secondary | ICD-10-CM | POA: Diagnosis not present

## 2022-12-15 DIAGNOSIS — R6520 Severe sepsis without septic shock: Secondary | ICD-10-CM | POA: Diagnosis not present

## 2022-12-15 DIAGNOSIS — G9340 Encephalopathy, unspecified: Secondary | ICD-10-CM | POA: Diagnosis not present

## 2022-12-15 DIAGNOSIS — A419 Sepsis, unspecified organism: Secondary | ICD-10-CM | POA: Diagnosis not present

## 2022-12-15 LAB — GLUCOSE, CAPILLARY
Glucose-Capillary: 143 mg/dL — ABNORMAL HIGH (ref 70–99)
Glucose-Capillary: 101 mg/dL — ABNORMAL HIGH (ref 70–99)
Glucose-Capillary: 120 mg/dL — ABNORMAL HIGH (ref 70–99)

## 2022-12-15 MED ORDER — OXYCODONE HCL 5 MG PO TABS: 5.0000 mg | ORAL_TABLET | Freq: Two times a day (BID) | ORAL | 0 refills | Status: DC

## 2022-12-15 MED ORDER — SULFAMETHOXAZOLE-TRIMETHOPRIM 800-160 MG PO TABS
1.0000 | ORAL_TABLET | Freq: Once | ORAL | Status: AC
  Administered 2022-12-15: 1 via ORAL
  Filled 2022-12-15: qty 1

## 2022-12-15 NOTE — Plan of Care (Signed)
  Problem: Coping: Goal: Ability to adjust to condition or change in health will improve Outcome: Progressing   Problem: Nutritional: Goal: Maintenance of adequate nutrition will improve Outcome: Progressing   Problem: Skin Integrity: Goal: Risk for impaired skin integrity will decrease Outcome: Progressing   Problem: Pain Managment: Goal: General experience of comfort will improve Outcome: Progressing   Problem: Safety: Goal: Ability to remain free from injury will improve Outcome: Progressing   Problem: Skin Integrity: Goal: Risk for impaired skin integrity will decrease Outcome: Progressing

## 2022-12-15 NOTE — TOC Transition Note (Addendum)
Transition of Care [facility]) - CM/SW Discharge Note   Patient Details  Name: Michelle Gay MRN: [id number] Date of Birth: August 05, 1950  [facility]) CM/SW Contact:  Mearl Latin, LCSW Phone Number: 12/15/2022, 11:04 AM   Clinical Narrative:    Patient will DC to: [facility] Anticipated DC date: 12/15/22 Family notified: Nicki Reaper Transport by: Sharin Mons   Per MD patient ready for DC to [facility]. RN to call report prior to discharge [phone] room 112B). RN, patient, patient's family, and facility notified of DC. Discharge Summary and FL2 sent to facility. DC packet on chart including signed script. Ambulance transport requested for patient.   CSW will sign off for now as social work intervention is no longer needed. Please consult [place] again if new needs arise.     Final next level of care: Skilled Nursing Facility Barriers to Discharge: Barriers Resolved   Patient Goals and CMS Choice CMS Medicare.gov Compare Post Acute Care list provided to:: Patient Choice offered to / list presented to : Patient  Discharge Placement     Existing PASRR number confirmed : 12/15/22          Patient [facility] bed at: [facility] Patient to be transferred to facility by: [facility] Name of family member notified: Rande Lawman Patient and family notified of of transfer: 12/15/22  Discharge Plan and Services Additional resources added to the After Visit Summary for                                       Social Determinants of Health (SDOH) Interventions SDOH Screenings   Food Insecurity: No Food Insecurity (09/20/2022)  Transportation Needs: No Transportation Needs (09/20/2022)  Tobacco Use: Unknown (12/08/2022)     Readmission Risk Interventions     No data to display

## 2022-12-15 NOTE — Discharge Instructions (Signed)
Follow with Primary MD Durenda Hurt, Flossie Buffy, MD/SNF physician  Get CBC, CMP,  checked  by Primary MD next visit.     Disposition SNF   Diet: Heart Healthy    On your next visit with your primary care physician please Get Medicines reviewed and adjusted.   Please request your Prim.MD to go over all Hospital Tests and Procedure/Radiological results at the follow up, please get all Hospital records sent to your Prim MD by signing hospital release before you go home.   If you experience worsening of your admission symptoms, develop shortness of breath, life threatening emergency, suicidal or homicidal thoughts you must seek medical attention immediately by calling 911 or calling your MD immediately  if symptoms less severe.  You Must read complete instructions/literature along with all the possible adverse reactions/side effects for all the Medicines you take and that have been prescribed to you. Take any new Medicines after you have completely understood and accpet all the possible adverse reactions/side effects.   Do not drive, operating heavy machinery, perform activities at heights, swimming or participation in water activities or provide baby sitting services if your were admitted for syncope or siezures until you have seen by Primary MD or a Neurologist and advised to do so again.  Do not drive when taking Pain medications.    Do not take more than prescribed Pain, Sleep and Anxiety Medications  Special Instructions: If you have smoked or chewed Tobacco  in the last 2 yrs please stop smoking, stop any regular Alcohol  and or any Recreational drug use.  Wear Seat belts while driving.   Please note  You were cared for by a hospitalist during your hospital stay. If you have any questions about your discharge medications or the care you received while you were in the hospital after you are discharged, you can call the unit and asked to speak with the hospitalist on call if the  hospitalist that took care of you is not available. Once you are discharged, your primary care physician will handle any further medical issues. Please note that NO REFILLS for any discharge medications will be authorized once you are discharged, as it is imperative that you return to your primary care physician (or establish a relationship with a primary care physician if you do not have one) for your aftercare needs so that they can reassess your need for medications and monitor your lab values.

## 2022-12-15 NOTE — Progress Notes (Signed)
Report called to receiving facility.

## 2022-12-15 NOTE — [place] FL2 (Signed)
[place] MEDICAID FL2 LEVEL OF CARE FORM     IDENTIFICATION  Patient Name: Michelle Gay Birthdate: May 28, 1950 Sex: female Admission Date (Current Location): 12/08/2022  [facility] and [place] Number:  Producer, television/film/video and Address:  [facility], [address], [place], [place] [address]      Provider Number: [id number]  Attending Physician Name and Address:  Elgergawy, Leana Roe, MD  Relative Name and Phone Number:       Current Level of Care: Hospital Recommended Level of Care: Skilled Nursing Facility Prior Approval Number:    Date Approved/Denied:   PASRR Number: [id number]  Discharge Plan: SNF    Current Diagnoses: Patient Active Problem List   Diagnosis Date Noted   Sepsis with acute organ dysfunction (HCC) 12/09/2022   Acute encephalopathy 12/09/2022   Decubitus ulcer of buttock, stage 2 (HCC) 12/09/2022   Severe sepsis (HCC) 12/09/2022   COPD (chronic obstructive pulmonary disease) (HCC)    DM2 (diabetes mellitus, type 2) (HCC)    HTN (hypertension)    Morbid obesity with BMI of 60.0-69.9, adult (HCC)    UTI (urinary tract infection) 09/17/2022    Orientation RESPIRATION BLADDER Height & Weight     Self, Time, Situation, Place  Normal Incontinent, External catheter Weight: (!) 350 lb (158.8 kg) (pt unable to answer) Height:  5\' 4"  (162.6 cm)  BEHAVIORAL SYMPTOMS/MOOD NEUROLOGICAL BOWEL NUTRITION STATUS      Incontinent Diet (See dc summary)  AMBULATORY STATUS COMMUNICATION OF NEEDS Skin   Extensive Assist Verbally PU Stage and Appropriate Care (Stage II on buttocks)                       Personal Care Assistance Level of Assistance  Bathing, Feeding, Dressing Bathing Assistance: Maximum assistance Feeding assistance: Limited assistance Dressing Assistance: Maximum assistance     Functional Limitations Info  Sight Sight Info: Impaired        SPECIAL CARE FACTORS FREQUENCY                        Contractures Contractures Info: Not present    Additional Factors Info  Psychotropic, Code Status, Allergies, Insulin Sliding Scale, Isolation Precautions Code Status Info: Full Allergies Info: NKA Psychotropic Info: Cymbalta Insulin Sliding Scale Info: see dc summary Isolation Precautions Info: ESBL     Current Medications (12/15/2022):  This is the current hospital active medication list Current Facility-Administered Medications  Medication Dose Route Frequency Provider Last Rate Last Admin   acetaminophen (TYLENOL) tablet 650 mg  650 mg Oral Q6H PRN Hillary Bow, DO   650 mg at 12/15/22 0019   Or   acetaminophen (TYLENOL) suppository 650 mg  650 mg Rectal Q6H PRN Hillary Bow, DO       aspirin chewable tablet 81 mg  81 mg Oral Daily Dorcas Carrow, MD   81 mg at 12/15/22 0758   aspirin-acetaminophen-caffeine (EXCEDRIN MIGRAINE) per tablet 1 tablet  1 tablet Oral Daily PRN Dorcas Carrow, MD   1 tablet at 12/15/22 0758   atorvastatin (LIPITOR) tablet 10 mg  10 mg Oral QHS Dorcas Carrow, MD   10 mg at 12/14/22 2229   budesonide (PULMICORT) nebulizer solution 0.5 mg  0.5 mg Nebulization q12n4p Dorcas Carrow, MD   0.5 mg at 12/15/22 0809   calcium carbonate (TUMS - dosed in mg elemental calcium) chewable tablet 400 mg of elemental calcium  2 tablet Oral Daily Dorcas Carrow,  MD   400 mg of elemental calcium at 12/15/22 0758   cholecalciferol (VITAMIN D3) 25 MCG (1000 UNIT) tablet 2,000 Units  2,000 Units Oral Daily Dorcas Carrow, MD   2,000 Units at 12/15/22 0758   diclofenac Sodium (VOLTAREN) 1 % topical gel 4 g  4 g Topical Daily Dorcas Carrow, MD   4 g at 12/15/22 0759   DULoxetine (CYMBALTA) DR capsule 60 mg  60 mg Oral Daily Dorcas Carrow, MD   60 mg at 12/15/22 0758   enoxaparin (LOVENOX) injection 80 mg  80 mg Subcutaneous Q24H Dorcas Carrow, MD   80 mg at 12/15/22 0757   famotidine (PEPCID) tablet 20 mg  20 mg Oral Daily Dorcas Carrow, MD   20 mg at 12/15/22 1308    ferrous sulfate tablet 325 mg  325 mg Oral Q breakfast Dorcas Carrow, MD   325 mg at 12/15/22 0758   fluticasone (FLONASE) 50 MCG/ACT nasal spray 1 spray  1 spray Each Nare Daily Dorcas Carrow, MD   1 spray at 12/15/22 0759   folic acid (FOLVITE) tablet 1 mg  1 mg Oral Daily Dorcas Carrow, MD   1 mg at 12/15/22 0758   gabapentin (NEURONTIN) capsule 400 mg  400 mg Oral BID Elgergawy, Leana Roe, MD   400 mg at 12/15/22 0758   insulin aspart (novoLOG) injection 0-9 Units  0-9 Units Subcutaneous Q4H Hillary Bow, DO   2 Units at 12/15/22 0942   ipratropium (ATROVENT) nebulizer solution 0.5 mg  0.5 mg Nebulization Q6H PRN Dorcas Carrow, MD   0.5 mg at 12/10/22 1118   meropenem (MERREM) 1 g in sodium chloride 0.9 % 100 mL IVPB  1 g Intravenous Q8H Elgergawy, Leana Roe, MD 200 mL/hr at 12/14/22 0556 1 g at 12/14/22 0556   methotrexate (RHEUMATREX) tablet 5 mg  5 mg Oral Weekly Dorcas Carrow, MD   5 mg at 12/09/22 2123   montelukast (SINGULAIR) tablet 10 mg  10 mg Oral Daily Dorcas Carrow, MD   10 mg at 12/15/22 0758   ondansetron (ZOFRAN) tablet 4 mg  4 mg Oral Q6H PRN Hillary Bow, DO       Or   ondansetron [facility]) injection 4 mg  4 mg Intravenous Q6H PRN Hillary Bow, DO       oxyCODONE (Oxy IR/ROXICODONE) immediate release tablet 5 mg  5 mg Oral BID Dorcas Carrow, MD   5 mg at 12/15/22 0758   polyethylene glycol (MIRALAX / GLYCOLAX) packet 17 g  17 g Oral Daily Dorcas Carrow, MD   17 g at 12/15/22 0757   senna-docusate (Senokot-S) tablet 2 tablet  2 tablet Oral Daily Dorcas Carrow, MD   2 tablet at 12/15/22 0757   topiramate (TOPAMAX) tablet 25 mg  25 mg Oral BID Dorcas Carrow, MD   25 mg at 12/15/22 6578     Discharge Medications: Please see discharge summary for a list of discharge medications.  Relevant Imaging Results:  Relevant Lab Results:   Additional Information SSN: [id number] [place], [place]

## 2022-12-15 NOTE — Progress Notes (Signed)
IV team was not successful in placing PIV line. Advised for PICC line. Made Dr Joneen Roach aware and gave Bactrim DS PO instead.

## 2022-12-15 NOTE — Discharge Summary (Addendum)
Physician Discharge Summary  Michelle Gay NGE:[id number] DOB: 04/02/1951 DOA: 12/08/2022  PCP: Justin Mend, MD  Admit date: 12/08/2022 Discharge date: 12/15/2022  Admitted From: (SNF) Disposition:  (SNF)  Recommendations for Outpatient Follow-up:  - Please check CBC, BMP in 3 days  - Blood pressure medicine has been continued during hospital stay, monitor blood pressure closely and resume if needed  Diet recommendation: Heart Healthy / Carb Modified   Brief/Interim Summary:  Michelle Gay is a 72 y.o. female with medical history significant of COPD, DM2, HTN, MCI, morbid obesity. She presents to ED from facility with AMS, N/V and lower abd pain over past few days.  Increased urinary frequency.Pt satting 97% on RA when we can get a wave form.  - Of note, pt with very similar presentation with AMS due to UTI back in May. -Her workup significant for sepsis due to UTI, urine culture growing ESBL, for which she was treated for total of 3 days of meropenem, mental status back to baseline.  Sepsis with acute organ dysfunction (HCC) -Sepsis present on admission, he presents, with tachypnea, tachycardia and altered mental status, this has resolved . -Culture growing ESBL, initially on Rocephin, has been changed to meropenem, she finished 3 days total of meropenem, mentation back to baseline,  no further antibiotic needed on discharge. -1 out of 4 blood culture growing Staphylococcus epidermidis,this is contaminant.   Acute metabolic encephalopathy Likely delirium secondary to UTI with sepsis.   Mentation much improved, back to baseline with treating her infection   COPD (chronic obstructive pulmonary disease) (HCC) No active wheezing for now,   Morbid obesity with BMI of 60.0-69.9, adult (HCC) Resume ozympic on discharge   HTN (hypertension) Medication has been held during hospital stay due to soft blood pressure, continue to hold meds at time of discharge as patient had  acceptable blood pressure during hospital stay.   DM2 (diabetes mellitus, type 2) (HCC) Resume home meds on discharge   Rheumatoid arthritis - continue with methotrexate      Decubitus ulcer of buttock, stage 2 (HCC) Bilateral stage 2 decubitus ulcers of buttocks, ~1cm diameter in each case.  No surrounding erythema, exudate, crepitus, etc to suggest active infection of these ulcers. -continue with wound care Pressure Injury 12/09/22 Buttocks Right;Left Stage 2 -  Partial thickness loss of dermis presenting as a shallow open injury with a red, pink wound bed without slough. (Active)  12/09/22   Location: Buttocks  Location Orientation: Right;Left  Staging: Stage 2 -  Partial thickness loss of dermis presenting as a shallow open injury with a red, pink wound bed without slough.  Wound Description (Comments):   Present on Admission: Yes      Discharge Diagnoses:  Principal Problem:   Sepsis with acute organ dysfunction (HCC) Active Problems:   UTI (urinary tract infection)   COPD (chronic obstructive pulmonary disease) (HCC)   Acute encephalopathy   Decubitus ulcer of buttock, stage 2 (HCC)   DM2 (diabetes mellitus, type 2) (HCC)   HTN (hypertension)   Morbid obesity with BMI of 60.0-69.9, adult (HCC)   Severe sepsis [facility])    Discharge Instructions  Discharge Instructions     Diet - low sodium heart healthy   Complete by: As directed    Discharge instructions   Complete by: As directed    Follow with Primary MD Durenda Hurt, Flossie Buffy, MD/SNF physician  Get CBC, CMP,  checked  by Primary MD next visit.     Disposition SNF  Diet: Heart Healthy    On your next visit with your primary care physician please Get Medicines reviewed and adjusted.   Please request your Prim.MD to go over all Hospital Tests and Procedure/Radiological results at the follow up, please get all Hospital records sent to your Prim MD by signing hospital release before you go  home.   If you experience worsening of your admission symptoms, develop shortness of breath, life threatening emergency, suicidal or homicidal thoughts you must seek medical attention immediately by calling 911 or calling your MD immediately  if symptoms less severe.  You Must read complete instructions/literature along with all the possible adverse reactions/side effects for all the Medicines you take and that have been prescribed to you. Take any new Medicines after you have completely understood and accpet all the possible adverse reactions/side effects.   Do not drive, operating heavy machinery, perform activities at heights, swimming or participation in water activities or provide baby sitting services if your were admitted for syncope or siezures until you have seen by Primary MD or a Neurologist and advised to do so again.  Do not drive when taking Pain medications.    Do not take more than prescribed Pain, Sleep and Anxiety Medications  Special Instructions: If you have smoked or chewed Tobacco  in the last 2 yrs please stop smoking, stop any regular Alcohol  and or any Recreational drug use.  Wear Seat belts while driving.   Please note  You were cared for by a hospitalist during your hospital stay. If you have any questions about your discharge medications or the care you received while you were in the hospital after you are discharged, you can call the unit and asked to speak with the hospitalist on call if the hospitalist that took care of you is not available. Once you are discharged, your primary care physician will handle any further medical issues. Please note that NO REFILLS for any discharge medications will be authorized once you are discharged, as it is imperative that you return to your primary care physician (or establish a relationship with a primary care physician if you do not have one) for your aftercare needs so that they can reassess your need for medications and  monitor your lab values.   Discharge wound care:   Complete by: As directed    Pressure Injury 12/09/22 Buttocks Right;Left Stage 2 -  Partial thickness loss of dermis presenting as a shallow open injury with a red, pink wound bed without slough.   Increase activity slowly   Complete by: As directed       Allergies as of 12/15/2022   No Known Allergies      Medication List     STOP taking these medications    valsartan 80 MG tablet Commonly known as: DIOVAN       TAKE these medications    acetaminophen 500 MG tablet Commonly known as: TYLENOL Take 1,000 mg by mouth in the morning and at bedtime.   albuterol 108 (90 Base) MCG/ACT inhaler Commonly known as: VENTOLIN HFA Inhale 2 puffs into the lungs every 6 (six) hours as needed for shortness of breath.   aspirin 81 MG chewable tablet Chew 81 mg by mouth daily.   aspirin-acetaminophen-caffeine 250-250-65 MG tablet Commonly known as: EXCEDRIN MIGRAINE Take 1 tablet by mouth daily as needed for headache or migraine.   atorvastatin 10 MG tablet Commonly known as: LIPITOR Take 10 mg by mouth at bedtime.   BAZA PROTECT  MOISTURE BARRIER EX Apply 1 Application topically in the morning and at bedtime.   Biofreeze Professional 5 % Gel Generic drug: Menthol (Topical Analgesic) Apply 1 Application topically in the morning and at bedtime. To left arm & shoulder   budesonide 0.5 MG/2ML nebulizer solution Commonly known as: PULMICORT Take 0.5 mg by nebulization 2 times daily at 12 noon and 4 pm.   calcium carbonate 500 MG chewable tablet Commonly known as: TUMS - dosed in mg elemental calcium Chew 2 tablets by mouth daily.   Cepacol Sore Throat 10-2.1 MG Lozg Generic drug: Benzocaine-Menthol Use as directed 1 lozenge in the mouth or throat daily.   Cholecalciferol 50 MCG (2000 UT) Tabs Take 2,000 Units by mouth daily.   cloNIDine 0.1 MG tablet Commonly known as: CATAPRES Take 0.1 mg by mouth every 12 (twelve)  hours as needed (hypertension). If SBP > 160 or DBP >100. If BP >160/100 1 hour after administration call MD.   cycloSPORINE 0.05 % ophthalmic emulsion Commonly known as: RESTASIS Place 2 drops into both eyes 2 (two) times daily.   diclofenac Sodium 1 % Gel Commonly known as: VOLTAREN Apply 4 g topically daily. To bilateral hands   DULoxetine 60 MG capsule Commonly known as: CYMBALTA Take 60 mg by mouth daily.   famotidine 20 MG tablet Commonly known as: PEPCID Take 20 mg by mouth daily.   feeding supplement Liqd Take 237 mLs by mouth 3 (three) times daily between meals.   ferrous sulfate 325 (65 FE) MG tablet Take 325 mg by mouth daily with breakfast.   fluticasone 50 MCG/ACT nasal spray Commonly known as: FLONASE Place 1 spray into both nostrils daily.   folic acid 1 MG tablet Commonly known as: FOLVITE Take 1 mg by mouth daily.   gabapentin 800 MG tablet Commonly known as: NEURONTIN Take 800 mg by mouth 2 (two) times daily.   HM Lidocaine Patch 4 % Generic drug: lidocaine Place 0.5 patches onto the skin daily.   ipratropium 0.02 % nebulizer solution Commonly known as: ATROVENT Take 0.5 mg by nebulization in the morning, at noon, and at bedtime.   methotrexate 2.5 MG tablet Commonly known as: RHEUMATREX Take 5 mg by mouth once a week. Caution:Chemotherapy. Protect from light. Tuesdays   montelukast 10 MG tablet Commonly known as: SINGULAIR Take 10 mg by mouth daily.   oxyCODONE 5 MG immediate release tablet Commonly known as: Oxy IR/ROXICODONE Take 1 tablet (5 mg total) by mouth 2 (two) times daily.   Ozempic (0.25 or 0.5 MG/DOSE) 2 MG/3ML Sopn Generic drug: Semaglutide(0.25 or 0.5MG /DOS) Inject 0.5 mg into the skin once a week. Tuesdays   polyethylene glycol 17 g packet Commonly known as: MIRALAX / GLYCOLAX Take 17 g by mouth daily.   sennosides-docusate sodium 8.6-50 MG tablet Commonly known as: SENOKOT-S Take 2 tablets by mouth daily.    tiZANidine 4 MG tablet Commonly known as: ZANAFLEX Take 2 mg by mouth in the morning and at bedtime.   topiramate 25 MG tablet Commonly known as: TOPAMAX Take 25 mg by mouth 2 (two) times daily.               Discharge Care Instructions  (From admission, onward)           Start     Ordered   12/15/22 0000  Discharge wound care:       Comments: Pressure Injury 12/09/22 Buttocks Right;Left Stage 2 -  Partial thickness loss of dermis presenting as a shallow  open injury with a red, pink wound bed without slough.   12/15/22 0930            No Known Allergies  Consultations: none   Procedures/Studies: CT Head Wo Contrast  Result Date: 12/08/2022 CLINICAL DATA:  Altered mental status, nontraumatic (Ped 0-17y) EXAM: CT HEAD WITHOUT CONTRAST TECHNIQUE: Contiguous axial images were obtained from the base of the skull through the vertex without intravenous contrast. RADIATION DOSE REDUCTION: This exam was performed according to the departmental dose-optimization program which includes automated exposure control, adjustment of the mA and/or kV according to patient size and/or use of iterative reconstruction technique. COMPARISON:  09/17/2022 FINDINGS: Brain: No acute intracranial abnormality. Specifically, no hemorrhage, hydrocephalus, mass lesion, acute infarction, or significant intracranial injury. Vascular: No hyperdense vessel or unexpected calcification. Skull: No acute calvarial abnormality. Sinuses/Orbits: No acute findings Other: None IMPRESSION: No acute intracranial abnormality. Electronically Signed   By: Charlett Nose M.D.   On: 12/08/2022 20:35   DG Chest Portable 1 View  Result Date: 12/08/2022 CLINICAL DATA:  Shortness of breath. EXAM: PORTABLE CHEST 1 VIEW COMPARISON:  Sep 17, 2022. FINDINGS: Stable cardiomegaly. No acute pulmonary disease is noted. Bony thorax is unremarkable. IMPRESSION: No active disease. Electronically Signed   By: Lupita Raider M.D.   On:  12/08/2022 18:12      Subjective:  No significant events overnight as discussed with staff, she denies any complaints today - D/W niece at time of discharge Discharge Exam: Vitals:   12/15/22 0800 12/15/22 0832  BP: (!) 124/57 129/87  Pulse: 84 (!) 104  Resp: (!) 22 20  Temp:  97.9 F (36.6 C)  SpO2: 97% 98%   Vitals:   12/14/22 2336 12/15/22 0413 12/15/22 0800 12/15/22 0832  BP: 100/80 (!) 110/57 (!) 124/57 129/87  Pulse: 86 88 84 (!) 104  Resp: 16 18 (!) 22 20  Temp: 98.3 F (36.8 C) 97.8 F (36.6 C)  97.9 F (36.6 C)  TempSrc: Oral Oral  Oral  SpO2: 95% 93% 97% 98%  Weight:      Height:        General: Pt is alert, awake, not in acute distress, pleasant, no apparent distress, answering questions appropriately and following commands Cardiovascular: RRR, S1/S2 +, no rubs, no gallops Respiratory: CTA bilaterally, no wheezing, no rhonchi Abdominal: Soft, NT, ND, bowel sounds + Extremities: She is with trace edema, significantly deconditioned with gross weakness at baseline    The results of significant diagnostics from this hospitalization (including imaging, microbiology, ancillary and laboratory) are listed below for reference.     Microbiology: Recent Results (from the past 240 hour(s))  Blood Culture (routine x 2)     Status: None   Collection Time: 12/08/22  4:52 PM   Specimen: BLOOD  Result Value Ref Range Status   Specimen Description BLOOD SITE NOT SPECIFIED  Final   Special Requests   Final    BOTTLES DRAWN AEROBIC AND ANAEROBIC Blood Culture adequate volume   Culture   Final    NO GROWTH 5 DAYS Performed at [facility], [address], [place], [place] [address]    Report Status 12/13/2022 FINAL  Final  Blood Culture (routine x 2)     Status: Abnormal   Collection Time: 12/08/22  5:06 PM   Specimen: BLOOD  Result Value Ref Range Status   Specimen Description BLOOD LEFT ANTECUBITAL  Final   Special Requests   Final    BOTTLES DRAWN  AEROBIC  AND ANAEROBIC Blood Culture adequate volume   Culture  Setup Time   Final    GRAM POSITIVE COCCI IN BOTH AEROBIC AND ANAEROBIC BOTTLES CRITICAL RESULT CALLED TO, READ BACK BY AND VERIFIED WITH: PHARMD ERIN WILLIAMSON ON 12/09/22 @ 1948 BY DRT    Culture (A)  Final    STAPHYLOCOCCUS EPIDERMIDIS THE SIGNIFICANCE OF ISOLATING THIS ORGANISM FROM A SINGLE SET OF BLOOD CULTURES WHEN MULTIPLE SETS ARE DRAWN IS UNCERTAIN. PLEASE NOTIFY THE MICROBIOLOGY DEPARTMENT WITHIN ONE WEEK IF SPECIATION AND SENSITIVITIES ARE REQUIRED. Performed at [facility], [address], [place], [place] [address]    Report Status 12/13/2022 FINAL  Final  Blood Culture ID Panel (Reflexed)     Status: Abnormal   Collection Time: 12/08/22  5:06 PM  Result Value Ref Range Status   Enterococcus faecalis NOT DETECTED NOT DETECTED Final   Enterococcus Faecium NOT DETECTED NOT DETECTED Final   Listeria monocytogenes NOT DETECTED NOT DETECTED Final   Staphylococcus species DETECTED (A) NOT DETECTED Final    Comment: CRITICAL RESULT CALLED TO, READ BACK BY AND VERIFIED WITH: PHARMD ERIN WILLIAMSON ON 12/09/22 @ 1948 BY DRT    Staphylococcus aureus (BCID) NOT DETECTED NOT DETECTED Final   Staphylococcus epidermidis DETECTED (A) NOT DETECTED Final    Comment: Methicillin (oxacillin) resistant coagulase negative staphylococcus. Possible blood culture contaminant (unless isolated from more than one blood culture draw or clinical case suggests pathogenicity). No antibiotic treatment is indicated for blood  culture contaminants. CRITICAL RESULT CALLED TO, READ BACK BY AND VERIFIED WITH: PHARMD ERIN WILLIAMSON ON 12/09/22 @ 1948 BY DRT    Staphylococcus lugdunensis NOT DETECTED NOT DETECTED Final   Streptococcus species NOT DETECTED NOT DETECTED Final   Streptococcus agalactiae NOT DETECTED NOT DETECTED Final   Streptococcus pneumoniae NOT DETECTED NOT DETECTED Final   Streptococcus pyogenes NOT DETECTED NOT DETECTED  Final   A.calcoaceticus-baumannii NOT DETECTED NOT DETECTED Final   Bacteroides fragilis NOT DETECTED NOT DETECTED Final   Enterobacterales NOT DETECTED NOT DETECTED Final   Enterobacter cloacae complex NOT DETECTED NOT DETECTED Final   Escherichia coli NOT DETECTED NOT DETECTED Final   Klebsiella aerogenes NOT DETECTED NOT DETECTED Final   Klebsiella oxytoca NOT DETECTED NOT DETECTED Final   Klebsiella pneumoniae NOT DETECTED NOT DETECTED Final   Proteus species NOT DETECTED NOT DETECTED Final   Salmonella species NOT DETECTED NOT DETECTED Final   Serratia marcescens NOT DETECTED NOT DETECTED Final   Haemophilus influenzae NOT DETECTED NOT DETECTED Final   Neisseria meningitidis NOT DETECTED NOT DETECTED Final   Pseudomonas aeruginosa NOT DETECTED NOT DETECTED Final   Stenotrophomonas maltophilia NOT DETECTED NOT DETECTED Final   Candida albicans NOT DETECTED NOT DETECTED Final   Candida auris NOT DETECTED NOT DETECTED Final   Candida glabrata NOT DETECTED NOT DETECTED Final   Candida krusei NOT DETECTED NOT DETECTED Final   Candida parapsilosis NOT DETECTED NOT DETECTED Final   Candida tropicalis NOT DETECTED NOT DETECTED Final   Cryptococcus neoformans/gattii NOT DETECTED NOT DETECTED Final   Methicillin resistance mecA/C DETECTED (A) NOT DETECTED Final    Comment: CRITICAL RESULT CALLED TO, READ BACK BY AND VERIFIED WITH: PHARMD ERIN WILLIAMSON ON 12/09/22 @ 1948 BY DRT Performed at [facility], [address] [place], [place] [address]   Resp panel by RT-PCR (RSV, Flu A&B, Covid) Anterior Nasal Swab     Status: None   Collection Time: 12/08/22  5:10 PM   Specimen: Anterior Nasal Swab  Result Value Ref Range Status   SARS Coronavirus 2 by RT PCR NEGATIVE NEGATIVE Final   Influenza A by PCR NEGATIVE NEGATIVE Final   Influenza B by PCR NEGATIVE NEGATIVE Final    Comment: (NOTE) The Xpert Xpress SARS-CoV-2/FLU/RSV plus assay is intended as an aid in the diagnosis of  influenza from Nasopharyngeal swab specimens and should not be used as a sole basis for treatment. Nasal washings and aspirates are unacceptable for Xpert Xpress SARS-CoV-2/FLU/RSV testing.  Fact Sheet for Patients: BloggerCourse.com  Fact Sheet for Healthcare Providers: SeriousBroker.it  This test is not yet approved or cleared by the [place] FDA and has been authorized for detection and/or diagnosis of SARS-CoV-2 by FDA under an Emergency Use Authorization (EUA). This EUA will remain in effect (meaning this test can be used) for the duration of the COVID-19 declaration under Section 564(b)(1) of the Act, 21 U.S.C. section 360bbb-3(b)(1), unless the authorization is terminated or revoked.     Resp Syncytial Virus by PCR NEGATIVE NEGATIVE Final    Comment: (NOTE) Fact Sheet for Patients: BloggerCourse.com  Fact Sheet for Healthcare Providers: SeriousBroker.it  This test is not yet approved or cleared by the [place] FDA and has been authorized for detection and/or diagnosis of SARS-CoV-2 by FDA under an Emergency Use Authorization (EUA). This EUA will remain in effect (meaning this test can be used) for the duration of the COVID-19 declaration under Section 564(b)(1) of the Act, 21 U.S.C. section 360bbb-3(b)(1), unless the authorization is terminated or revoked.  Performed at [facility], [address], [place], [place] [address]   Urine Culture (for pregnant, neutropenic or urologic patients or patients with an indwelling urinary catheter)     Status: Abnormal   Collection Time: 12/09/22  9:27 AM   Specimen: Urine, Clean Catch  Result Value Ref Range Status   Specimen Description URINE, CLEAN CATCH  Final   Special Requests   Final    NONE Performed at [facility], [address], [place], [place] [address]    Culture (A)  Final    60,000  COLONIES/mL ESCHERICHIA COLI Confirmed Extended Spectrum Beta-Lactamase Producer (ESBL).  In bloodstream infections from ESBL organisms, carbapenems are preferred over piperacillin/tazobactam. They are shown to have a lower risk of mortality.    Report Status 12/11/2022 FINAL  Final   Organism ID, Bacteria ESCHERICHIA COLI (A)  Final      Susceptibility   Escherichia coli - MIC*    AMPICILLIN >=32 RESISTANT Resistant     CEFAZOLIN >=64 RESISTANT Resistant     CEFEPIME >=32 RESISTANT Resistant     CEFTRIAXONE >=64 RESISTANT Resistant     CIPROFLOXACIN >=4 RESISTANT Resistant     GENTAMICIN <=1 SENSITIVE Sensitive     IMIPENEM <=0.25 SENSITIVE Sensitive     NITROFURANTOIN <=16 SENSITIVE Sensitive     TRIMETH/SULFA 40 SENSITIVE Sensitive     AMPICILLIN/SULBACTAM >=32 RESISTANT Resistant     PIP/TAZO 8 SENSITIVE Sensitive     * 60,000 COLONIES/mL ESCHERICHIA COLI     Labs: BNP (last 3 results) Recent Labs    09/17/22 1159  BNP 38.2   Basic Metabolic Panel: Recent Labs  Lab 12/08/22 1702 12/08/22 1756 12/09/22 0047 12/09/22 0459 12/11/22 0259 12/15/22 0652  NA 139 140 139 137 135 136  K 4.2 4.3 4.0 4.2 3.7 4.1  CL 98  --   --  104 100 101  CO2 22  --   --  22 24 26  GLUCOSE 160*  --   --  143* 126* 109*  BUN 21  --   --  21 24* 13  CREATININE 1.25*  --   --  1.26* 1.12* 1.20*  CALCIUM 10.0  --   --  9.3 8.9 9.3   Liver Function Tests: Recent Labs  Lab 12/08/22 1702 12/09/22 0459  AST 27 21  ALT 27 21  ALKPHOS 96 83  BILITOT 0.5 0.7  PROT 8.1 7.6  ALBUMIN 2.9* 2.6*   Recent Labs  Lab 12/08/22 1702  LIPASE 35   No results for input(s): "AMMONIA" in the last 168 hours. CBC: Recent Labs  Lab 12/08/22 1702 12/08/22 1756 12/09/22 0047 12/09/22 0459 12/11/22 0259 12/15/22 0652  WBC 7.4  --   --  7.6 5.4 4.5  NEUTROABS 4.5  --   --   --   --   --   HGB 10.7* 12.6 11.6* 10.3* 9.2* 9.8*  HCT 37.0 37.0 34.0* 34.4* 29.9* 32.9*  MCV 95.4  --   --  93.5 90.9  92.2  PLT 231  --   --  212 226 324   Cardiac Enzymes: No results for input(s): "CKTOTAL", "CKMB", "CKMBINDEX", "TROPONINI" in the last 168 hours. BNP: Invalid input(s): "POCBNP" CBG: Recent Labs  Lab 12/14/22 1945 12/14/22 2341 12/15/22 0405 12/15/22 0829 12/15/22 0923  GLUCAP 103* 137* 143* 101* 120*   D-Dimer No results for input(s): "DDIMER" in the last 72 hours. Hgb A1c No results for input(s): "HGBA1C" in the last 72 hours. Lipid Profile No results for input(s): "CHOL", "HDL", "LDLCALC", "TRIG", "CHOLHDL", "LDLDIRECT" in the last 72 hours. Thyroid function studies No results for input(s): "TSH", "T4TOTAL", "T3FREE", "THYROIDAB" in the last 72 hours.  Invalid input(s): "FREET3" Anemia work up No results for input(s): "VITAMINB12", "FOLATE", "FERRITIN", "TIBC", "IRON", "RETICCTPCT" in the last 72 hours. Urinalysis    Component Value Date/Time   COLORURINE YELLOW 12/08/2022 1858   APPEARANCEUR TURBID (A) 12/08/2022 1858   LABSPEC 1.028 12/08/2022 1858   PHURINE 5.0 12/08/2022 1858   GLUCOSEU NEGATIVE 12/08/2022 1858   HGBUR LARGE (A) 12/08/2022 1858   BILIRUBINUR NEGATIVE 12/08/2022 1858   KETONESUR NEGATIVE 12/08/2022 1858   PROTEINUR 100 (A) 12/08/2022 1858   NITRITE NEGATIVE 12/08/2022 1858   LEUKOCYTESUR MODERATE (A) 12/08/2022 1858   Sepsis Labs Recent Labs  Lab 12/08/22 1702 12/09/22 0459 12/11/22 0259 12/15/22 0652  WBC 7.4 7.6 5.4 4.5   Microbiology Recent Results (from the past 240 hour(s))  Blood Culture (routine x 2)     Status: None   Collection Time: 12/08/22  4:52 PM   Specimen: BLOOD  Result Value Ref Range Status   Specimen Description BLOOD SITE NOT SPECIFIED  Final   Special Requests   Final    BOTTLES DRAWN AEROBIC AND ANAEROBIC Blood Culture adequate volume   Culture   Final    NO GROWTH 5 DAYS Performed at [facility], [address] [place], [place] [address]    Report Status 12/13/2022 FINAL  Final  Blood Culture  (routine x 2)     Status: Abnormal   Collection Time: 12/08/22  5:06 PM   Specimen: BLOOD  Result Value Ref Range Status   Specimen Description BLOOD LEFT ANTECUBITAL  Final   Special Requests   Final    BOTTLES DRAWN AEROBIC AND ANAEROBIC Blood Culture adequate volume   Culture  Setup Time   Final    GRAM POSITIVE COCCI IN BOTH AEROBIC AND ANAEROBIC  BOTTLES CRITICAL RESULT CALLED TO, READ BACK BY AND VERIFIED WITH: PHARMD ERIN WILLIAMSON ON 12/09/22 @ 1948 BY DRT    Culture (A)  Final    STAPHYLOCOCCUS EPIDERMIDIS THE SIGNIFICANCE OF ISOLATING THIS ORGANISM FROM A SINGLE SET OF BLOOD CULTURES WHEN MULTIPLE SETS ARE DRAWN IS UNCERTAIN. PLEASE NOTIFY THE MICROBIOLOGY DEPARTMENT WITHIN ONE WEEK IF SPECIATION AND SENSITIVITIES ARE REQUIRED. Performed at [facility], [address] [place], [place] [address]    Report Status 12/13/2022 FINAL  Final  Blood Culture ID Panel (Reflexed)     Status: Abnormal   Collection Time: 12/08/22  5:06 PM  Result Value Ref Range Status   Enterococcus faecalis NOT DETECTED NOT DETECTED Final   Enterococcus Faecium NOT DETECTED NOT DETECTED Final   Listeria monocytogenes NOT DETECTED NOT DETECTED Final   Staphylococcus species DETECTED (A) NOT DETECTED Final    Comment: CRITICAL RESULT CALLED TO, READ BACK BY AND VERIFIED WITH: PHARMD ERIN WILLIAMSON ON 12/09/22 @ 1948 BY DRT    Staphylococcus aureus (BCID) NOT DETECTED NOT DETECTED Final   Staphylococcus epidermidis DETECTED (A) NOT DETECTED Final    Comment: Methicillin (oxacillin) resistant coagulase negative staphylococcus. Possible blood culture contaminant (unless isolated from more than one blood culture draw or clinical case suggests pathogenicity). No antibiotic treatment is indicated for blood  culture contaminants. CRITICAL RESULT CALLED TO, READ BACK BY AND VERIFIED WITH: PHARMD ERIN WILLIAMSON ON 12/09/22 @ 1948 BY DRT    Staphylococcus lugdunensis NOT DETECTED NOT DETECTED Final    Streptococcus species NOT DETECTED NOT DETECTED Final   Streptococcus agalactiae NOT DETECTED NOT DETECTED Final   Streptococcus pneumoniae NOT DETECTED NOT DETECTED Final   Streptococcus pyogenes NOT DETECTED NOT DETECTED Final   A.calcoaceticus-baumannii NOT DETECTED NOT DETECTED Final   Bacteroides fragilis NOT DETECTED NOT DETECTED Final   Enterobacterales NOT DETECTED NOT DETECTED Final   Enterobacter cloacae complex NOT DETECTED NOT DETECTED Final   Escherichia coli NOT DETECTED NOT DETECTED Final   Klebsiella aerogenes NOT DETECTED NOT DETECTED Final   Klebsiella oxytoca NOT DETECTED NOT DETECTED Final   Klebsiella pneumoniae NOT DETECTED NOT DETECTED Final   Proteus species NOT DETECTED NOT DETECTED Final   Salmonella species NOT DETECTED NOT DETECTED Final   Serratia marcescens NOT DETECTED NOT DETECTED Final   Haemophilus influenzae NOT DETECTED NOT DETECTED Final   Neisseria meningitidis NOT DETECTED NOT DETECTED Final   Pseudomonas aeruginosa NOT DETECTED NOT DETECTED Final   Stenotrophomonas maltophilia NOT DETECTED NOT DETECTED Final   Candida albicans NOT DETECTED NOT DETECTED Final   Candida auris NOT DETECTED NOT DETECTED Final   Candida glabrata NOT DETECTED NOT DETECTED Final   Candida krusei NOT DETECTED NOT DETECTED Final   Candida parapsilosis NOT DETECTED NOT DETECTED Final   Candida tropicalis NOT DETECTED NOT DETECTED Final   Cryptococcus neoformans/gattii NOT DETECTED NOT DETECTED Final   Methicillin resistance mecA/C DETECTED (A) NOT DETECTED Final    Comment: CRITICAL RESULT CALLED TO, READ BACK BY AND VERIFIED WITH: PHARMD ERIN WILLIAMSON ON 12/09/22 @ 1948 BY DRT Performed at [facility], [address] [place], [place] [address]   Resp panel by RT-PCR (RSV, Flu A&B, Covid) Anterior Nasal Swab     Status: None   Collection Time: 12/08/22  5:10 PM   Specimen: Anterior Nasal Swab  Result Value Ref Range Status   SARS Coronavirus 2 by RT PCR  NEGATIVE NEGATIVE Final   Influenza A by PCR NEGATIVE NEGATIVE Final  Influenza B by PCR NEGATIVE NEGATIVE Final    Comment: (NOTE) The Xpert Xpress SARS-CoV-2/FLU/RSV plus assay is intended as an aid in the diagnosis of influenza from Nasopharyngeal swab specimens and should not be used as a sole basis for treatment. Nasal washings and aspirates are unacceptable for Xpert Xpress SARS-CoV-2/FLU/RSV testing.  Fact Sheet for Patients: BloggerCourse.com  Fact Sheet for Healthcare Providers: SeriousBroker.it  This test is not yet approved or cleared by the [place] FDA and has been authorized for detection and/or diagnosis of SARS-CoV-2 by FDA under an Emergency Use Authorization (EUA). This EUA will remain in effect (meaning this test can be used) for the duration of the COVID-19 declaration under Section 564(b)(1) of the Act, 21 U.S.C. section 360bbb-3(b)(1), unless the authorization is terminated or revoked.     Resp Syncytial Virus by PCR NEGATIVE NEGATIVE Final    Comment: (NOTE) Fact Sheet for Patients: BloggerCourse.com  Fact Sheet for Healthcare Providers: SeriousBroker.it  This test is not yet approved or cleared by the [place] FDA and has been authorized for detection and/or diagnosis of SARS-CoV-2 by FDA under an Emergency Use Authorization (EUA). This EUA will remain in effect (meaning this test can be used) for the duration of the COVID-19 declaration under Section 564(b)(1) of the Act, 21 U.S.C. section 360bbb-3(b)(1), unless the authorization is terminated or revoked.  Performed at [facility], [address] [place], [place] [address]   Urine Culture (for pregnant, neutropenic or urologic patients or patients with an indwelling urinary catheter)     Status: Abnormal   Collection Time: 12/09/22  9:27 AM   Specimen: Urine, Clean Catch  Result  Value Ref Range Status   Specimen Description URINE, CLEAN CATCH  Final   Special Requests   Final    NONE Performed at [facility], [address] [place], [place] [address]    Culture (A)  Final    60,000 COLONIES/mL ESCHERICHIA COLI Confirmed Extended Spectrum Beta-Lactamase Producer (ESBL).  In bloodstream infections from ESBL organisms, carbapenems are preferred over piperacillin/tazobactam. They are shown to have a lower risk of mortality.    Report Status 12/11/2022 FINAL  Final   Organism ID, Bacteria ESCHERICHIA COLI (A)  Final      Susceptibility   Escherichia coli - MIC*    AMPICILLIN >=32 RESISTANT Resistant     CEFAZOLIN >=64 RESISTANT Resistant     CEFEPIME >=32 RESISTANT Resistant     CEFTRIAXONE >=64 RESISTANT Resistant     CIPROFLOXACIN >=4 RESISTANT Resistant     GENTAMICIN <=1 SENSITIVE Sensitive     IMIPENEM <=0.25 SENSITIVE Sensitive     NITROFURANTOIN <=16 SENSITIVE Sensitive     TRIMETH/SULFA 40 SENSITIVE Sensitive     AMPICILLIN/SULBACTAM >=32 RESISTANT Resistant     PIP/TAZO 8 SENSITIVE Sensitive     * 60,000 COLONIES/mL ESCHERICHIA COLI     Time coordinating discharge: Over 30 minutes  SIGNED:   Huey Bienenstock, MD  [facility] 12/15/2022, 9:44 AM Pager   If 7PM-7AM, please contact night-coverage [email or web address] Password [email or web address]

## 2023-03-14 ENCOUNTER — Other Ambulatory Visit: Payer: Self-pay

## 2023-03-14 ENCOUNTER — Emergency Department (HOSPITAL_COMMUNITY): Payer: Medicare Other

## 2023-03-14 ENCOUNTER — Encounter (HOSPITAL_COMMUNITY): Payer: Self-pay

## 2023-03-14 ENCOUNTER — Inpatient Hospital Stay (HOSPITAL_COMMUNITY)
Admission: EM | Admit: 2023-03-14 | Discharge: 2023-03-15 | DRG: 064 | Disposition: A | Discharge status: Other Acute Inpatient Hospital | Payer: Medicare Other | Source: Skilled Nursing Facility | Attending: Pulmonary Disease | Admitting: Pulmonary Disease

## 2023-03-14 DIAGNOSIS — Z66 Do not resuscitate: Secondary | ICD-10-CM | POA: Diagnosis present

## 2023-03-14 DIAGNOSIS — I619 Nontraumatic intracerebral hemorrhage, unspecified: Secondary | ICD-10-CM | POA: Diagnosis not present

## 2023-03-14 DIAGNOSIS — E119 Type 2 diabetes mellitus without complications: Secondary | ICD-10-CM | POA: Diagnosis present

## 2023-03-14 DIAGNOSIS — E785 Hyperlipidemia, unspecified: Secondary | ICD-10-CM | POA: Diagnosis present

## 2023-03-14 DIAGNOSIS — Z9911 Dependence on respirator [ventilator] status: Secondary | ICD-10-CM

## 2023-03-14 DIAGNOSIS — I612 Nontraumatic intracerebral hemorrhage in hemisphere, unspecified: Secondary | ICD-10-CM | POA: Diagnosis not present

## 2023-03-14 DIAGNOSIS — B957 Other staphylococcus as the cause of diseases classified elsewhere: Secondary | ICD-10-CM | POA: Diagnosis present

## 2023-03-14 DIAGNOSIS — I611 Nontraumatic intracerebral hemorrhage in hemisphere, cortical: Secondary | ICD-10-CM | POA: Diagnosis present

## 2023-03-14 DIAGNOSIS — R4182 Altered mental status, unspecified: Secondary | ICD-10-CM | POA: Diagnosis present

## 2023-03-14 DIAGNOSIS — G935 Compression of brain: Secondary | ICD-10-CM | POA: Diagnosis present

## 2023-03-14 DIAGNOSIS — E876 Hypokalemia: Secondary | ICD-10-CM | POA: Diagnosis present

## 2023-03-14 DIAGNOSIS — Z1152 Encounter for screening for COVID-19: Secondary | ICD-10-CM

## 2023-03-14 DIAGNOSIS — R29732 NIHSS score 32: Secondary | ICD-10-CM | POA: Diagnosis present

## 2023-03-14 DIAGNOSIS — J15212 Pneumonia due to Methicillin resistant Staphylococcus aureus: Secondary | ICD-10-CM | POA: Diagnosis present

## 2023-03-14 DIAGNOSIS — G936 Cerebral edema: Secondary | ICD-10-CM | POA: Diagnosis present

## 2023-03-14 DIAGNOSIS — A419 Sepsis, unspecified organism: Principal | ICD-10-CM

## 2023-03-14 DIAGNOSIS — R7881 Bacteremia: Secondary | ICD-10-CM | POA: Diagnosis present

## 2023-03-14 DIAGNOSIS — R578 Other shock: Secondary | ICD-10-CM | POA: Diagnosis present

## 2023-03-14 DIAGNOSIS — J9600 Acute respiratory failure, unspecified whether with hypoxia or hypercapnia: Secondary | ICD-10-CM | POA: Diagnosis not present

## 2023-03-14 DIAGNOSIS — E872 Acidosis, unspecified: Secondary | ICD-10-CM | POA: Diagnosis present

## 2023-03-14 DIAGNOSIS — J9602 Acute respiratory failure with hypercapnia: Secondary | ICD-10-CM | POA: Diagnosis present

## 2023-03-14 DIAGNOSIS — E870 Hyperosmolality and hypernatremia: Secondary | ICD-10-CM | POA: Diagnosis present

## 2023-03-14 DIAGNOSIS — I10 Essential (primary) hypertension: Secondary | ICD-10-CM | POA: Diagnosis present

## 2023-03-14 DIAGNOSIS — J449 Chronic obstructive pulmonary disease, unspecified: Secondary | ICD-10-CM | POA: Diagnosis present

## 2023-03-14 DIAGNOSIS — I629 Nontraumatic intracranial hemorrhage, unspecified: Principal | ICD-10-CM

## 2023-03-14 DIAGNOSIS — G9382 Brain death: Secondary | ICD-10-CM | POA: Diagnosis present

## 2023-03-14 DIAGNOSIS — R579 Shock, unspecified: Secondary | ICD-10-CM | POA: Diagnosis not present

## 2023-03-14 DIAGNOSIS — G9341 Metabolic encephalopathy: Secondary | ICD-10-CM

## 2023-03-14 DIAGNOSIS — Z79899 Other long term (current) drug therapy: Secondary | ICD-10-CM | POA: Diagnosis not present

## 2023-03-14 DIAGNOSIS — Z6841 Body Mass Index (BMI) 40.0 and over, adult: Secondary | ICD-10-CM | POA: Diagnosis not present

## 2023-03-14 DIAGNOSIS — J9601 Acute respiratory failure with hypoxia: Secondary | ICD-10-CM | POA: Diagnosis not present

## 2023-03-14 LAB — BASIC METABOLIC PANEL
Anion gap: 11 (ref 5–15)
BUN: 15 mg/dL (ref 8–23)
CO2: 23 mmol/L (ref 22–32)
Calcium: 8.7 mg/dL — ABNORMAL LOW (ref 8.9–10.3)
Chloride: 107 mmol/L (ref 98–111)
Creatinine, Ser: 0.98 mg/dL (ref 0.44–1.00)
GFR, Estimated: >60 mL/min (ref >60)
Glucose, Bld: 214 mg/dL — ABNORMAL HIGH (ref 70–99)
Potassium: 3.3 mmol/L — ABNORMAL LOW (ref 3.5–5.1)
Sodium: 141 mmol/L (ref 135–145)

## 2023-03-14 LAB — I-STAT CHEM 8, ED
BUN: 17 mg/dL (ref 8–23)
Calcium, Ion: 1.21 mmol/L (ref 1.15–1.40)
Chloride: 106 mmol/L (ref 98–111)
Creatinine, Ser: 1 mg/dL (ref 0.44–1.00)
Glucose, Bld: 232 mg/dL — ABNORMAL HIGH (ref 70–99)
HCT: 40 % (ref 36.0–46.0)
Hemoglobin: 13.6 g/dL (ref 12.0–15.0)
Potassium: 3.7 mmol/L (ref 3.5–5.1)
Sodium: 143 mmol/L (ref 135–145)
TCO2: 23 mmol/L (ref 22–32)

## 2023-03-14 LAB — CBC WITH DIFFERENTIAL/PLATELET
Abs Immature Granulocytes: 0.03 10*3/uL (ref 0.00–0.07)
Basophils Absolute: 0 10*3/uL (ref 0.0–0.1)
Basophils Relative: 0 %
Eosinophils Absolute: 0 10*3/uL (ref 0.0–0.5)
Eosinophils Relative: 0 %
HCT: 38.5 % (ref 36.0–46.0)
Hemoglobin: 11.6 g/dL — ABNORMAL LOW (ref 12.0–15.0)
Immature Granulocytes: 0 %
Lymphocytes Relative: 23 %
Lymphs Abs: 1.6 10*3/uL (ref 0.7–4.0)
MCH: 27.5 pg (ref 26.0–34.0)
MCHC: 30.1 g/dL (ref 30.0–36.0)
MCV: 91.2 fL (ref 80.0–100.0)
Monocytes Absolute: 0.4 10*3/uL (ref 0.1–1.0)
Monocytes Relative: 6 %
Neutro Abs: 5 10*3/uL (ref 1.7–7.7)
Neutrophils Relative %: 71 %
Platelets: 286 10*3/uL (ref 150–400)
RBC: 4.22 MIL/uL (ref 3.87–5.11)
RDW: 15.5 % (ref 11.5–15.5)
WBC: 7.1 10*3/uL (ref 4.0–10.5)
nRBC: 0 % (ref 0.0–0.2)

## 2023-03-14 LAB — I-STAT VENOUS BLOOD GAS, ED
Acid-base deficit: 1 mmol/L (ref 0.0–2.0)
Bicarbonate: 26 mmol/L (ref 20.0–28.0)
Calcium, Ion: 1.2 mmol/L (ref 1.15–1.40)
HCT: 39 % (ref 36.0–46.0)
Hemoglobin: 13.3 g/dL (ref 12.0–15.0)
O2 Saturation: 95 %
Potassium: 3.9 mmol/L (ref 3.5–5.1)
Sodium: 142 mmol/L (ref 135–145)
TCO2: 28 mmol/L (ref 22–32)
pCO2, Ven: 51.5 mm[Hg] (ref 44–60)
pH, Ven: 7.311 (ref 7.25–7.43)
pO2, Ven: 85 mm[Hg] — ABNORMAL HIGH (ref 32–45)

## 2023-03-14 LAB — I-STAT CG4 LACTIC ACID, ED: Lactic Acid, Venous: 2.2 mmol/L (ref 0.5–1.9)

## 2023-03-14 LAB — TROPONIN I (HIGH SENSITIVITY): Troponin I (High Sensitivity): 17 ng/L (ref <18)

## 2023-03-14 MED ORDER — FENTANYL CITRATE PF 50 MCG/ML IJ SOSY
PREFILLED_SYRINGE | INTRAMUSCULAR | Status: AC
  Filled 2023-03-14: qty 1

## 2023-03-14 MED ORDER — ETOMIDATE 2 MG/ML IV SOLN
INTRAVENOUS | Status: DC | PRN
  Administered 2023-03-14: 10 mg via INTRAVENOUS

## 2023-03-14 MED ORDER — FENTANYL CITRATE PF 50 MCG/ML IJ SOSY
PREFILLED_SYRINGE | INTRAMUSCULAR | Status: DC | PRN
Start: 2023-03-14 — End: 2023-03-15
  Administered 2023-03-14: 50 ug via INTRAVENOUS

## 2023-03-14 MED ORDER — ROCURONIUM BROMIDE 50 MG/5ML IV SOLN
INTRAVENOUS | Status: DC | PRN
  Administered 2023-03-14: 100 mg via INTRAVENOUS

## 2023-03-14 NOTE — ED Provider Notes (Incomplete)
[facility]-EMERGENCY DEPT [facility] Emergency Department Provider Note MRN:  [id number]  Arrival date & time: 03/15/23     Chief Complaint   Respiratory Distress   History of Present Illness   Michelle Gay is a 72 y.o. year-old female presents to the ED with chief complaint of AMS.  BIB EMS after staff of nursing home found patient to be unresponsive.  Baseline is GCS 15.  After dinner was found to be unresponsive.  No response to pain, IV placement, or nasal trumpet placement.  Was not ever reported to be hypoxic, but arrives on NRB mask and sonorous respirations.  Full Code  Hx provided by EMS and chart review.   Review of Systems  Pertinent positive and negative review of systems noted in HPI.    Physical Exam   Vitals:   03/15/23 0015 03/15/23 0030  BP: (!) 110/52 (!) 114/59  Pulse: 87 87  Resp: 20 20  Temp: (!) 96.5 F (35.8 C) (!) 96.8 F (36 C)  SpO2: 100% 100%    CONSTITUTIONAL:  unresponsive, high BMI NEURO:  Alert and oriented x 3, GCS 3, no gag EYES:  no corneal reflexes ENT/NECK:  Supple, no stridor  CARDIO:  bradycardic initially, regular rhythm, intact distal pulses PULM:  Sonorous respirations, crackles noted GI/GU:  non-distended, no pain response MSK/SPINE:  No gross deformities, no edema,  SKIN:  no rash, atraumatic   *Additional and/or pertinent findings included in MDM below  Diagnostic and Interventional Summary    EKG Interpretation Date/Time:  Saturday March 14 2023 22:50:52 EDT Ventricular Rate:  87 PR Interval:  173 QRS Duration:  89 QT Interval:  432 QTC Calculation: 520 R Axis:   36  Text Interpretation: Sinus rhythm Atrial premature complex Low voltage, precordial leads Prolonged QT interval Confirmed by Ross Marcus ([id number]) on 03/14/2023 11:22:12 PM       Labs Reviewed  CBC WITH DIFFERENTIAL/PLATELET - Abnormal; Notable for the following components:      Result Value   Hemoglobin 11.6 (*)    All other components  within normal limits  BASIC METABOLIC PANEL - Abnormal; Notable for the following components:   Potassium 3.3 (*)    Glucose, Bld 214 (*)    Calcium 8.7 (*)    All other components within normal limits  I-STAT VENOUS BLOOD GAS, ED - Abnormal; Notable for the following components:   pO2, Ven 85 (*)    All other components within normal limits  I-STAT CG4 LACTIC ACID, ED - Abnormal; Notable for the following components:   Lactic Acid, Venous 2.2 (*)    All other components within normal limits  I-STAT CHEM 8, ED - Abnormal; Notable for the following components:   Glucose, Bld 232 (*)    All other components within normal limits  BRAIN NATRIURETIC PEPTIDE  COMPREHENSIVE METABOLIC PANEL  I-STAT CG4 LACTIC ACID, ED  TROPONIN I (HIGH SENSITIVITY)  TROPONIN I (HIGH SENSITIVITY)    CT HEAD WO CONTRAST ( )  Final Result    DG Chest Portable 1 View  Final Result    DG Abdomen 1 View  Final Result      Medications  etomidate (AMIDATE) injection (10 mg Intravenous Given 03/14/23 2304)  rocuronium (ZEMURON) injection (100 mg Intravenous Given 03/14/23 2305)  fentaNYL (SUBLIMAZE) injection (50 mcg Intravenous Given 03/14/23 2307)     Procedures  /  Critical Care Procedure Name: Intubation Date/Time: 03/14/2023 11:26 PM  Performed by: Roxy Horseman, PA-CPre-anesthesia Checklist: Patient identified, Patient being monitored,  Emergency Drugs available, Timeout performed and Suction available Oxygen Delivery Method: Non-rebreather mask Preoxygenation: Pre-oxygenation with 100% oxygen Induction Type: Rapid sequence Ventilation: Mask ventilation without difficulty Laryngoscope Size: Glidescope and 4 Tube size: 7.5 mm Number of attempts: 1 Placement Confirmation: ETT inserted through vocal cords under direct vision, CO2 detector and Breath sounds checked- equal and bilateral Secured at: 25 cm Tube secured with: ETT holder Dental Injury: Teeth and Oropharynx as per pre-operative  assessment     .Critical Care  Performed by: Roxy Horseman, PA-C Authorized by: Roxy Horseman, PA-C   Critical care provider statement:    Critical care time (minutes):  50   Critical care was necessary to treat or prevent imminent or life-threatening deterioration of the following conditions:  CNS failure or compromise   Critical care was time spent personally by me on the following activities:  Development of treatment plan with patient or surrogate, discussions with consultants, evaluation of patient's response to treatment, examination of patient, ordering and review of laboratory studies, ordering and review of radiographic studies, ordering and performing treatments and interventions, pulse oximetry, re-evaluation of patient's condition and review of old charts   ED Course and Medical Decision Making  I have reviewed the triage vital signs, the nursing notes, and pertinent available records from the EMR.  Social Determinants Affecting Complexity of Care: Patient has no clinically significant social determinants affecting this chief complaint..   ED Course: Clinical Course as of 03/15/23 0052  Sat Mar 14, 2023  2339 Patient immediately seen by me, Dr. Silverio Lay and Dr. Wilkie Aye.    Consideration given to bipap vs intubation.  Feel that patient needs CT to rule out bleed/stroke.  Don't feel that patient is safe for CT given her unresponsive state, wet sounding lungs, and potential for losing her airway if she lies flat. [RB]  2349 Attempted to call next of kin listed in chart.  Voicemail left. [CH]  Sun Mar 15, 2023  0049 CT HEAD WO CONTRAST ( ) Large ICH [RB]  0049 I-stat chem 8, ed(!) No significant electrolyte derangement  [RB]  0049 CBC with Differential(!) No leukocytosis or anemia [RB]    Clinical Course User Index [CH] Horton, Mayer Masker, MD [RB] Roxy Horseman, PA-C    Medical Decision Making Patient BIB EMS unresponsive from nursing home.  GCS 3.  No gag.  No  corneal reflexes.  Will need CT to rule out bleed, but concern over airway for CT.  Seen by and discussed with Dr. Wilkie Aye, who agrees with intubation.  Patient intubated by me with Dr. Wilkie Aye supervising.   Devastating bleed seen on CT.  Discussed with consultants below.    We were able to finally reach family, but they didn't want to make patient DNR until after they have arrived.    Problems Addressed: Intracranial hemorrhage (HCC): acute illness or injury that poses a threat to life or bodily functions  Amount and/or Complexity of Data Reviewed Labs: ordered. Decision-making details documented in ED Course. Radiology: ordered and independent interpretation performed. Decision-making details documented in ED Course.  Risk Prescription drug management. Decision regarding hospitalization.         Consultants: I consulted with Dr. Derry Lory, who recommends no intervention as this unfortunately is a devastating event. I consulted with Dr. Jamison Neighbor, from ICU, who will bring patient in for palliative and until family can be notified.  Dr. Jamison Neighbor recommends holding off on ICU consult until after family arrives.  Uncertain how long it will take for family to  arrive. Discussed with Dr. Wilkie Aye, who recommends that per protocol neurology should admit until goals of care discussion can be had with family.  Appreciate Dr. Derry Lory for admitting.  Treatment and Plan: Patient's exam and diagnostic results are concerning for ICH.  Feel that patient will need admission to the hospital for further treatment and evaluation.  Patient seen by and discussed with attending physician, Dr. Wilkie Aye, who agrees with plan.  Final Clinical Impressions(s) / ED Diagnoses     ICD-10-CM   1. Intracranial hemorrhage (HCC)  I62.9       ED Discharge Orders     None         Discharge Instructions Discussed with and Provided to Patient:   Discharge Instructions   None      Roxy Horseman,  PA-C 03/15/23 0052    Roxy Horseman, PA-C 03/15/23 0155    Shon Baton, MD 03/15/23 [phone]

## 2023-03-14 NOTE — ED Triage Notes (Addendum)
Arrives [facility] from [facility].   Staff report baseline GCS of 15. Found at 2115 after dinner unresponsive after eating. They say she takes pain medications so they administered narcan with no improvement. At 1930 pt was normal.   Upon paramedic arrival pr unresponsive. Arrives on NRB and snoring. NPA in place

## 2023-03-14 NOTE — Code Documentation (Signed)
Pt on cardiac monitor.   IV patent.   Prep to intubate for AMS, unresponsive, no gag reflex, safery measures.   Time out by Doreene Adas, MD at bedside.

## 2023-03-15 ENCOUNTER — Encounter (HOSPITAL_COMMUNITY): Payer: Self-pay | Admitting: Neurology

## 2023-03-15 ENCOUNTER — Inpatient Hospital Stay (HOSPITAL_COMMUNITY): Payer: Medicare Other

## 2023-03-15 DIAGNOSIS — E870 Hyperosmolality and hypernatremia: Secondary | ICD-10-CM | POA: Diagnosis present

## 2023-03-15 DIAGNOSIS — G936 Cerebral edema: Secondary | ICD-10-CM | POA: Diagnosis present

## 2023-03-15 DIAGNOSIS — G935 Compression of brain: Secondary | ICD-10-CM | POA: Diagnosis present

## 2023-03-15 DIAGNOSIS — I612 Nontraumatic intracerebral hemorrhage in hemisphere, unspecified: Secondary | ICD-10-CM

## 2023-03-15 DIAGNOSIS — Z6841 Body Mass Index (BMI) 40.0 and over, adult: Secondary | ICD-10-CM | POA: Diagnosis not present

## 2023-03-15 DIAGNOSIS — Z1152 Encounter for screening for COVID-19: Secondary | ICD-10-CM | POA: Diagnosis not present

## 2023-03-15 DIAGNOSIS — R578 Other shock: Secondary | ICD-10-CM | POA: Diagnosis present

## 2023-03-15 DIAGNOSIS — J9602 Acute respiratory failure with hypercapnia: Secondary | ICD-10-CM | POA: Diagnosis present

## 2023-03-15 DIAGNOSIS — I619 Nontraumatic intracerebral hemorrhage, unspecified: Secondary | ICD-10-CM | POA: Diagnosis not present

## 2023-03-15 DIAGNOSIS — R29732 NIHSS score 32: Secondary | ICD-10-CM | POA: Diagnosis present

## 2023-03-15 DIAGNOSIS — J9601 Acute respiratory failure with hypoxia: Secondary | ICD-10-CM | POA: Diagnosis not present

## 2023-03-15 DIAGNOSIS — G9382 Brain death: Secondary | ICD-10-CM | POA: Diagnosis present

## 2023-03-15 DIAGNOSIS — B957 Other staphylococcus as the cause of diseases classified elsewhere: Secondary | ICD-10-CM | POA: Diagnosis present

## 2023-03-15 DIAGNOSIS — J15212 Pneumonia due to Methicillin resistant Staphylococcus aureus: Secondary | ICD-10-CM | POA: Diagnosis present

## 2023-03-15 DIAGNOSIS — E872 Acidosis, unspecified: Secondary | ICD-10-CM | POA: Diagnosis present

## 2023-03-15 DIAGNOSIS — I10 Essential (primary) hypertension: Secondary | ICD-10-CM | POA: Diagnosis present

## 2023-03-15 DIAGNOSIS — I629 Nontraumatic intracranial hemorrhage, unspecified: Secondary | ICD-10-CM

## 2023-03-15 DIAGNOSIS — Z66 Do not resuscitate: Secondary | ICD-10-CM | POA: Diagnosis present

## 2023-03-15 DIAGNOSIS — Z9911 Dependence on respirator [ventilator] status: Secondary | ICD-10-CM | POA: Diagnosis not present

## 2023-03-15 DIAGNOSIS — I611 Nontraumatic intracerebral hemorrhage in hemisphere, cortical: Secondary | ICD-10-CM | POA: Diagnosis present

## 2023-03-15 DIAGNOSIS — R579 Shock, unspecified: Secondary | ICD-10-CM | POA: Diagnosis not present

## 2023-03-15 DIAGNOSIS — Z79899 Other long term (current) drug therapy: Secondary | ICD-10-CM | POA: Diagnosis not present

## 2023-03-15 DIAGNOSIS — E876 Hypokalemia: Secondary | ICD-10-CM | POA: Diagnosis present

## 2023-03-15 DIAGNOSIS — E785 Hyperlipidemia, unspecified: Secondary | ICD-10-CM | POA: Diagnosis present

## 2023-03-15 DIAGNOSIS — J449 Chronic obstructive pulmonary disease, unspecified: Secondary | ICD-10-CM | POA: Diagnosis present

## 2023-03-15 DIAGNOSIS — R7881 Bacteremia: Secondary | ICD-10-CM | POA: Diagnosis present

## 2023-03-15 DIAGNOSIS — E119 Type 2 diabetes mellitus without complications: Secondary | ICD-10-CM | POA: Diagnosis present

## 2023-03-15 DIAGNOSIS — R4182 Altered mental status, unspecified: Secondary | ICD-10-CM | POA: Diagnosis present

## 2023-03-15 HISTORY — PX: PR ARTL CATHJ/CANNULJ MNTR/TRANSFUSION SPX PRQ: 36620

## 2023-03-15 LAB — COMPREHENSIVE METABOLIC PANEL
ALT: 11 U/L (ref 0–44)
ALT: 10 U/L (ref 0–44)
AST: 15 U/L (ref 15–41)
AST: 15 U/L (ref 15–41)
Albumin: 2.7 g/dL — ABNORMAL LOW (ref 3.5–5.0)
Albumin: 2.4 g/dL — ABNORMAL LOW (ref 3.5–5.0)
Alkaline Phosphatase: 85 U/L (ref 38–126)
Alkaline Phosphatase: 87 U/L (ref 38–126)
Anion gap: 10 (ref 5–15)
Anion gap: 7 (ref 5–15)
BUN: 17 mg/dL (ref 8–23)
BUN: 13 mg/dL (ref 8–23)
CO2: 24 mmol/L (ref 22–32)
CO2: 26 mmol/L (ref 22–32)
Calcium: 8.8 mg/dL — ABNORMAL LOW (ref 8.9–10.3)
Calcium: 8.9 mg/dL (ref 8.9–10.3)
Chloride: 110 mmol/L (ref 98–111)
Chloride: 121 mmol/L — ABNORMAL HIGH (ref 98–111)
Creatinine, Ser: 1.12 mg/dL — ABNORMAL HIGH (ref 0.44–1.00)
Creatinine, Ser: 1.17 mg/dL — ABNORMAL HIGH (ref 0.44–1.00)
GFR, Estimated: 53 mL/min — ABNORMAL LOW (ref >60)
GFR, Estimated: 50 mL/min — ABNORMAL LOW (ref >60)
Glucose, Bld: 173 mg/dL — ABNORMAL HIGH (ref 70–99)
Glucose, Bld: 241 mg/dL — ABNORMAL HIGH (ref 70–99)
Potassium: 3.8 mmol/L (ref 3.5–5.1)
Potassium: 3.7 mmol/L (ref 3.5–5.1)
Sodium: 144 mmol/L (ref 135–145)
Sodium: 154 mmol/L — ABNORMAL HIGH (ref 135–145)
Total Bilirubin: 0.2 mg/dL — ABNORMAL LOW (ref 0.3–1.2)
Total Bilirubin: 0.4 mg/dL (ref 0.3–1.2)
Total Protein: 7.5 g/dL (ref 6.5–8.1)
Total Protein: 7.4 g/dL (ref 6.5–8.1)

## 2023-03-15 LAB — POCT I-STAT 7, (LYTES, BLD GAS, ICA,H+H)
Acid-Base Excess: 0 mmol/L (ref 0.0–2.0)
Acid-Base Excess: 1 mmol/L (ref 0.0–2.0)
Acid-Base Excess: 0 mmol/L (ref 0.0–2.0)
Acid-base deficit: 4 mmol/L — ABNORMAL HIGH (ref 0.0–2.0)
Bicarbonate: 23.3 mmol/L (ref 20.0–28.0)
Bicarbonate: 25.6 mmol/L (ref 20.0–28.0)
Bicarbonate: 25.9 mmol/L (ref 20.0–28.0)
Bicarbonate: 29.2 mmol/L — ABNORMAL HIGH (ref 20.0–28.0)
Calcium, Ion: 1.27 mmol/L (ref 1.15–1.40)
Calcium, Ion: 1.29 mmol/L (ref 1.15–1.40)
Calcium, Ion: 1.33 mmol/L (ref 1.15–1.40)
Calcium, Ion: 1.34 mmol/L (ref 1.15–1.40)
HCT: 32 % — ABNORMAL LOW (ref 36.0–46.0)
HCT: 32 % — ABNORMAL LOW (ref 36.0–46.0)
HCT: 33 % — ABNORMAL LOW (ref 36.0–46.0)
HCT: 33 % — ABNORMAL LOW (ref 36.0–46.0)
Hemoglobin: 10.9 g/dL — ABNORMAL LOW (ref 12.0–15.0)
Hemoglobin: 10.9 g/dL — ABNORMAL LOW (ref 12.0–15.0)
Hemoglobin: 11.2 g/dL — ABNORMAL LOW (ref 12.0–15.0)
Hemoglobin: 11.2 g/dL — ABNORMAL LOW (ref 12.0–15.0)
O2 Saturation: 100 %
O2 Saturation: 100 %
O2 Saturation: 98 %
O2 Saturation: 99 %
Patient temperature: 34.8
Patient temperature: 36
Patient temperature: 36.3
Patient temperature: 36.5
Potassium: 3.4 mmol/L — ABNORMAL LOW (ref 3.5–5.1)
Potassium: 3.8 mmol/L (ref 3.5–5.1)
Potassium: 3.8 mmol/L (ref 3.5–5.1)
Potassium: 3.9 mmol/L (ref 3.5–5.1)
Sodium: 153 mmol/L — ABNORMAL HIGH (ref 135–145)
Sodium: 156 mmol/L — ABNORMAL HIGH (ref 135–145)
Sodium: 156 mmol/L — ABNORMAL HIGH (ref 135–145)
Sodium: 158 mmol/L — ABNORMAL HIGH (ref 135–145)
TCO2: 25 mmol/L (ref 22–32)
TCO2: 27 mmol/L (ref 22–32)
TCO2: 27 mmol/L (ref 22–32)
TCO2: 31 mmol/L (ref 22–32)
pCO2 arterial: 38.5 mm[Hg] (ref 32–48)
pCO2 arterial: 42 mm[Hg] (ref 32–48)
pCO2 arterial: 48.7 mm[Hg] — ABNORMAL HIGH (ref 32–48)
pCO2 arterial: 69 mm[Hg] (ref 32–48)
pH, Arterial: 7.231 — ABNORMAL LOW (ref 7.35–7.45)
pH, Arterial: 7.285 — ABNORMAL LOW (ref 7.35–7.45)
pH, Arterial: 7.394 (ref 7.35–7.45)
pH, Arterial: 7.42 (ref 7.35–7.45)
pO2, Arterial: 134 mm[Hg] — ABNORMAL HIGH (ref 83–108)
pO2, Arterial: 135 mm[Hg] — ABNORMAL HIGH (ref 83–108)
pO2, Arterial: 206 mm[Hg] — ABNORMAL HIGH (ref 83–108)
pO2, Arterial: 364 mm[Hg] — ABNORMAL HIGH (ref 83–108)

## 2023-03-15 LAB — PROTIME-INR
INR: 1.2 (ref 0.8–1.2)
Prothrombin Time: 15.7 s — ABNORMAL HIGH (ref 11.4–15.2)

## 2023-03-15 LAB — CBC WITH DIFFERENTIAL/PLATELET
Abs Immature Granulocytes: 0.03 10*3/uL (ref 0.00–0.07)
Basophils Absolute: 0 10*3/uL (ref 0.0–0.1)
Basophils Relative: 0 %
Eosinophils Absolute: 0.1 10*3/uL (ref 0.0–0.5)
Eosinophils Relative: 1 %
HCT: 36.6 % (ref 36.0–46.0)
Hemoglobin: 11.1 g/dL — ABNORMAL LOW (ref 12.0–15.0)
Immature Granulocytes: 0 %
Lymphocytes Relative: 29 %
Lymphs Abs: 2.3 10*3/uL (ref 0.7–4.0)
MCH: 27.9 pg (ref 26.0–34.0)
MCHC: 30.3 g/dL (ref 30.0–36.0)
MCV: 92 fL (ref 80.0–100.0)
Monocytes Absolute: 0.9 10*3/uL (ref 0.1–1.0)
Monocytes Relative: 11 %
Neutro Abs: 4.5 10*3/uL (ref 1.7–7.7)
Neutrophils Relative %: 59 %
Platelets: 276 10*3/uL (ref 150–400)
RBC: 3.98 MIL/uL (ref 3.87–5.11)
RDW: 15.9 % — ABNORMAL HIGH (ref 11.5–15.5)
WBC: 7.8 10*3/uL (ref 4.0–10.5)
nRBC: 0 % (ref 0.0–0.2)

## 2023-03-15 LAB — I-STAT ARTERIAL BLOOD GAS, ED
Acid-Base Excess: 0 mmol/L (ref 0.0–2.0)
Bicarbonate: 27.2 mmol/L (ref 20.0–28.0)
Calcium, Ion: 1.29 mmol/L (ref 1.15–1.40)
HCT: 40 % (ref 36.0–46.0)
Hemoglobin: 13.6 g/dL (ref 12.0–15.0)
O2 Saturation: 100 %
Patient temperature: 95.5
Potassium: 3.4 mmol/L — ABNORMAL LOW (ref 3.5–5.1)
Sodium: 144 mmol/L (ref 135–145)
TCO2: 29 mmol/L (ref 22–32)
pCO2 arterial: 50.1 mm[Hg] — ABNORMAL HIGH (ref 32–48)
pH, Arterial: 7.334 — ABNORMAL LOW (ref 7.35–7.45)
pO2, Arterial: 215 mm[Hg] — ABNORMAL HIGH (ref 83–108)

## 2023-03-15 LAB — URINALYSIS, W/ REFLEX TO CULTURE (INFECTION SUSPECTED)
Bilirubin Urine: NEGATIVE
Glucose, UA: NEGATIVE mg/dL
Ketones, ur: NEGATIVE mg/dL
Nitrite: NEGATIVE
Protein, ur: NEGATIVE mg/dL
Specific Gravity, Urine: 1.009 (ref 1.005–1.030)
pH: 5 (ref 5.0–8.0)

## 2023-03-15 LAB — GLUCOSE, CAPILLARY
Glucose-Capillary: 135 mg/dL — ABNORMAL HIGH (ref 70–99)
Glucose-Capillary: 135 mg/dL — ABNORMAL HIGH (ref 70–99)
Glucose-Capillary: 140 mg/dL — ABNORMAL HIGH (ref 70–99)
Glucose-Capillary: 149 mg/dL — ABNORMAL HIGH (ref 70–99)
Glucose-Capillary: 159 mg/dL — ABNORMAL HIGH (ref 70–99)
Glucose-Capillary: 193 mg/dL — ABNORMAL HIGH (ref 70–99)
Glucose-Capillary: 238 mg/dL — ABNORMAL HIGH (ref 70–99)

## 2023-03-15 LAB — PHOSPHORUS: Phosphorus: 2 mg/dL — ABNORMAL LOW (ref 2.5–4.6)

## 2023-03-15 LAB — CBC
HCT: 36.8 % (ref 36.0–46.0)
Hemoglobin: 11.1 g/dL — ABNORMAL LOW (ref 12.0–15.0)
MCH: 27.8 pg (ref 26.0–34.0)
MCHC: 30.2 g/dL (ref 30.0–36.0)
MCV: 92.2 fL (ref 80.0–100.0)
Platelets: 271 10*3/uL (ref 150–400)
RBC: 3.99 MIL/uL (ref 3.87–5.11)
RDW: 15.9 % — ABNORMAL HIGH (ref 11.5–15.5)
WBC: 7.5 10*3/uL (ref 4.0–10.5)
nRBC: 0 % (ref 0.0–0.2)

## 2023-03-15 LAB — BILIRUBIN, DIRECT: Bilirubin, Direct: 0.1 mg/dL (ref 0.0–0.2)

## 2023-03-15 LAB — I-STAT CG4 LACTIC ACID, ED: Lactic Acid, Venous: 3.2 mmol/L (ref 0.5–1.9)

## 2023-03-15 LAB — HEMOGLOBIN A1C
Hgb A1c MFr Bld: 6.2 % — ABNORMAL HIGH (ref 4.8–5.6)
Mean Plasma Glucose: 131.24 mg/dL

## 2023-03-15 LAB — LACTIC ACID, PLASMA: Lactic Acid, Venous: 1.8 mmol/L (ref 0.5–1.9)

## 2023-03-15 LAB — TYPE AND SCREEN
ABO/RH(D): O POS
Antibody Screen: NEGATIVE

## 2023-03-15 LAB — TROPONIN I (HIGH SENSITIVITY): Troponin I (High Sensitivity): 31 ng/L — ABNORMAL HIGH (ref <18)

## 2023-03-15 LAB — MRSA NEXT GEN BY PCR, NASAL: MRSA by PCR Next Gen: DETECTED — AB

## 2023-03-15 LAB — MAGNESIUM: Magnesium: 1.9 mg/dL (ref 1.7–2.4)

## 2023-03-15 LAB — BRAIN NATRIURETIC PEPTIDE: B Natriuretic Peptide: 62.9 pg/mL (ref 0.0–100.0)

## 2023-03-15 MED ORDER — ALBUMIN HUMAN 5 % IV SOLN
25.0000 g | Freq: Once | INTRAVENOUS | Status: AC
  Administered 2023-03-16: 25 g via INTRAVENOUS
  Filled 2023-03-15: qty 500

## 2023-03-15 MED ORDER — FENTANYL CITRATE PF 50 MCG/ML IJ SOSY
50.0000 ug | PREFILLED_SYRINGE | Freq: Once | INTRAMUSCULAR | Status: AC
  Administered 2023-03-15: 50 ug via INTRAVENOUS

## 2023-03-15 MED ORDER — SODIUM CHLORIDE 0.9 % IV SOLN: 250.0000 mL | INTRAVENOUS | Status: AC

## 2023-03-15 MED ORDER — INSULIN ASPART 100 UNIT/ML IJ SOLN
0.0000 [IU] | INTRAMUSCULAR | Status: DC
  Administered 2023-03-15 (×3): 1 [IU] via SUBCUTANEOUS
  Administered 2023-03-15 (×2): 2 [IU] via SUBCUTANEOUS
  Administered 2023-03-15 – 2023-03-16 (×2): 1 [IU] via SUBCUTANEOUS
  Administered 2023-03-16 (×3): 3 [IU] via SUBCUTANEOUS
  Administered 2023-03-16: 2 [IU] via SUBCUTANEOUS

## 2023-03-15 MED ORDER — FENTANYL CITRATE PF 50 MCG/ML IJ SOSY
PREFILLED_SYRINGE | INTRAMUSCULAR | Status: AC
  Filled 2023-03-15: qty 1

## 2023-03-15 MED ORDER — STROKE: EARLY STAGES OF RECOVERY BOOK
Freq: Once | Status: AC
Start: 2023-03-16 — End: 2023-03-16
  Filled 2023-03-15: qty 1

## 2023-03-15 MED ORDER — NOREPINEPHRINE 4 MG/250ML-% IV SOLN
2.0000 ug/min | INTRAVENOUS | Status: DC
  Administered 2023-03-15: 25 ug/min via INTRAVENOUS
  Administered 2023-03-15: 15 ug/min via INTRAVENOUS
  Filled 2023-03-15: qty 250

## 2023-03-15 MED ORDER — LEVOTHYROXINE SODIUM 100 MCG/5ML IV SOLN
20.0000 ug | Freq: Once | INTRAVENOUS | Status: AC
  Administered 2023-03-15: 20 ug via INTRAVENOUS
  Filled 2023-03-15: qty 5

## 2023-03-15 MED ORDER — FAMOTIDINE 20 MG PO TABS
20.0000 mg | ORAL_TABLET | Freq: Two times a day (BID) | ORAL | Status: DC
  Administered 2023-03-15 – 2023-03-18 (×7): 20 mg
  Filled 2023-03-15 (×7): qty 1

## 2023-03-15 MED ORDER — PHENYLEPHRINE HCL-NACL 20-0.9 MG/250ML-% IV SOLN
0.0000 ug/min | INTRAVENOUS | Status: DC
  Administered 2023-03-15: 20 ug/min via INTRAVENOUS
  Filled 2023-03-15 (×2): qty 250

## 2023-03-15 MED ORDER — INSULIN ASPART 100 UNIT/ML IJ SOLN
20.0000 [IU] | Freq: Once | INTRAMUSCULAR | Status: AC
  Administered 2023-03-15: 20 [IU] via SUBCUTANEOUS

## 2023-03-15 MED ORDER — ORAL CARE MOUTH RINSE
15.0000 mL | OROMUCOSAL | Status: DC
  Administered 2023-03-15 – 2023-03-18 (×39): 15 mL via OROMUCOSAL

## 2023-03-15 MED ORDER — VANCOMYCIN HCL 1500 MG/300ML IV SOLN
1500.0000 mg | Freq: Once | INTRAVENOUS | Status: AC
  Administered 2023-03-16: 1500 mg via INTRAVENOUS
  Filled 2023-03-15: qty 300

## 2023-03-15 MED ORDER — SENNOSIDES-DOCUSATE SODIUM 8.6-50 MG PO TABS
1.0000 | ORAL_TABLET | Freq: Two times a day (BID) | ORAL | Status: DC
  Administered 2023-03-15 – 2023-03-18 (×7): 1
  Filled 2023-03-15 (×7): qty 1

## 2023-03-15 MED ORDER — ACETAMINOPHEN 160 MG/5ML PO SOLN: 650.0000 mg | ORAL | Status: DC | PRN

## 2023-03-15 MED ORDER — NOREPINEPHRINE 4 MG/250ML-% IV SOLN
INTRAVENOUS | Status: AC
  Administered 2023-03-15: 10 ug/min via INTRAVENOUS
  Filled 2023-03-15: qty 250

## 2023-03-15 MED ORDER — NOREPINEPHRINE 16 MG/250ML-% IV SOLN: 2.0000 ug/min | INTRAVENOUS | Status: DC

## 2023-03-15 MED ORDER — ACETAMINOPHEN 650 MG RE SUPP: 650.0000 mg | RECTAL | Status: DC | PRN

## 2023-03-15 MED ORDER — PANTOPRAZOLE SODIUM 40 MG IV SOLR: 40.0000 mg | Freq: Every day | INTRAVENOUS | Status: DC

## 2023-03-15 MED ORDER — POTASSIUM CHLORIDE 20 MEQ PO PACK
40.0000 meq | PACK | Freq: Once | ORAL | Status: AC
  Administered 2023-03-15: 40 meq
  Filled 2023-03-15: qty 2

## 2023-03-15 MED ORDER — NOREPINEPHRINE 4 MG/250ML-% IV SOLN: 2.0000 ug/min | INTRAVENOUS | Status: DC

## 2023-03-15 MED ORDER — ORAL CARE MOUTH RINSE: 15.0000 mL | OROMUCOSAL | Status: DC | PRN

## 2023-03-15 MED ORDER — SODIUM CHLORIDE 0.9 % IV SOLN
2000.0000 mg | Freq: Once | INTRAVENOUS | Status: AC
  Administered 2023-03-15: 2000 mg via INTRAVENOUS
  Filled 2023-03-15: qty 32

## 2023-03-15 MED ORDER — SODIUM CHLORIDE 3 % IV BOLUS
250.0000 mL | Freq: Once | INTRAVENOUS | Status: AC
  Administered 2023-03-15: 250 mL via INTRAVENOUS
  Filled 2023-03-15: qty 500

## 2023-03-15 MED ORDER — DEXTROSE 50 % IV SOLN
1.0000 | Freq: Once | INTRAVENOUS | Status: AC
  Administered 2023-03-15: 50 mL via INTRAVENOUS
  Filled 2023-03-15: qty 50

## 2023-03-15 MED ORDER — SODIUM CHLORIDE 0.9 % IV SOLN
20.0000 ug/h | INTRAVENOUS | Status: DC
  Administered 2023-03-15 – 2023-03-18 (×6): 20 ug/h via INTRAVENOUS
  Filled 2023-03-15 (×6): qty 10

## 2023-03-15 MED ORDER — SODIUM CHLORIDE 0.9 % IV SOLN
2.0000 g | Freq: Three times a day (TID) | INTRAVENOUS | Status: DC
  Administered 2023-03-15 – 2023-03-16 (×2): 2 g via INTRAVENOUS
  Filled 2023-03-15 (×2): qty 12.5

## 2023-03-15 MED ORDER — CLEVIDIPINE BUTYRATE 0.5 MG/ML IV EMUL
0.0000 mg/h | INTRAVENOUS | Status: DC
Start: 2023-03-15 — End: 2023-03-18

## 2023-03-15 MED ORDER — LABETALOL HCL 5 MG/ML IV SOLN
20.0000 mg | Freq: Once | INTRAVENOUS | Status: AC
  Administered 2023-03-15: 20 mg via INTRAVENOUS
  Filled 2023-03-15: qty 4

## 2023-03-15 MED ORDER — SODIUM CHLORIDE 3 % IV SOLN
INTRAVENOUS | Status: DC
  Filled 2023-03-15 (×5): qty 500

## 2023-03-15 MED ORDER — MUPIROCIN 2 % EX OINT
TOPICAL_OINTMENT | Freq: Two times a day (BID) | CUTANEOUS | Status: DC
  Filled 2023-03-15 (×3): qty 22

## 2023-03-15 MED ORDER — FENTANYL CITRATE PF 50 MCG/ML IJ SOSY: 25.0000 ug | PREFILLED_SYRINGE | INTRAMUSCULAR | Status: DC | PRN

## 2023-03-15 MED ORDER — NOREPINEPHRINE 4 MG/250ML-% IV SOLN
0.0000 ug/min | INTRAVENOUS | Status: DC
  Administered 2023-03-15: 25 ug/min via INTRAVENOUS
  Administered 2023-03-16: 7 ug/min via INTRAVENOUS
  Administered 2023-03-16: 6 ug/min via INTRAVENOUS
  Administered 2023-03-16: 14 ug/min via INTRAVENOUS
  Administered 2023-03-17: 2 ug/min via INTRAVENOUS
  Filled 2023-03-15 (×7): qty 250

## 2023-03-15 MED ORDER — CHLORHEXIDINE GLUCONATE CLOTH 2 % EX PADS
6.0000 | MEDICATED_PAD | Freq: Every day | CUTANEOUS | Status: DC
Start: 2023-03-15 — End: 2023-03-18
  Administered 2023-03-15 – 2023-03-18 (×4): 6 via TOPICAL

## 2023-03-15 MED ORDER — ACETAMINOPHEN 325 MG PO TABS: 650.0000 mg | ORAL_TABLET | ORAL | Status: DC | PRN

## 2023-03-15 MED ORDER — NOREPINEPHRINE 4 MG/250ML-% IV SOLN
2.0000 ug/min | INTRAVENOUS | Status: DC
  Administered 2023-03-15: 7 ug/min via INTRAVENOUS
  Filled 2023-03-15 (×2): qty 250

## 2023-03-15 MED ORDER — PANTOPRAZOLE SODIUM 40 MG IV SOLR
40.0000 mg | Freq: Every day | INTRAVENOUS | Status: DC
  Administered 2023-03-15 – 2023-03-18 (×4): 40 mg via INTRAVENOUS
  Filled 2023-03-15 (×4): qty 10

## 2023-03-15 NOTE — Progress Notes (Signed)
eLink Physician-Brief Progress Note Patient Name: Michelle Gay DOB: 05/30/1950 MRN: [id number]   Date of Service  03/15/2023  HPI/Events of Note  Received request to increase ceiling limit of norepinephrine currently at 23 Patient does have a central line Also received request request for arterial line as femoral arterial line clotted off  eICU Interventions  Current norepinephrine is on peripheral dosing, switched to central line dose to be infused through central line Ordered arterial line insertion c/p RT Discussed with bedside RN and RT     Intervention Category Intermediate Interventions: Other:  Darl Pikes 03/15/2023, 9:01 PM

## 2023-03-15 NOTE — Procedures (Signed)
Bronchoscopy Procedure Note  Michelle Gay  [id number]  August 07, 1950  Date:03/15/23  Time:7:15 PM   Provider Performing:Nilsa Macht   Procedure(s):  Flexible bronchoscopy with bronchial alveolar lavage ([address]) and Initial Therapeutic Aspiration of Tracheobronchial Tree ([address])  Indication(s) Organ Procurement  Consent Risks of the procedure as well as the alternatives and risks of each were explained to the patient and/or caregiver.  Consent for the procedure was obtained and is signed in the bedside chart  Anesthesia None   Time Out Verified patient identification, verified procedure, site/side was marked, verified correct patient position, special equipment/implants available, medications/allergies/relevant history reviewed, required imaging and test results available.   Sterile Technique Usual hand hygiene, masks, gowns, and gloves were used   Procedure Description Bronchoscope advanced through endotracheal tube and into airway.  Airways were examined down to subsegmental level with findings noted below.   Following diagnostic evaluation, BAL(s) performed in RLL/LLL with normal saline and return of Whitish fluid and Therapeutic aspiration performed in LLL  Findings: Moderate amounts of tenacious secretions noted in LLL suctioned and BAL performed     Complications/Tolerance None; patient tolerated the procedure well. Chest X-ray is not needed post procedure.   EBL Minimal   Specimen(s) BAL

## 2023-03-15 NOTE — Progress Notes (Signed)
03/15/2023 Niece/POA Danajanette Ohanian-Phillips [phone]  Meriel Flavors [phone]) another niece is also en route.  Est here early afternoon.  Told them body may pass in interim.  Myrla Halsted MD PCCM

## 2023-03-15 NOTE — Procedures (Signed)
Central Venous Catheter Insertion Procedure Note  Michelle Gay  [id number]  1951/04/18  Date:03/15/23  Time:6:28 PM   Provider Performing:Harnoor Kohles D. Harris   Procedure: Insertion of Non-tunneled Central Venous Catheter(36556) with US guidance (78[address])   Indication(s) Medication administration and Difficult access  Consent Unable to obtain consent due to emergent nature of procedure.  Anesthesia Topical only with 1% lidocaine   Timeout Verified patient identification, verified procedure, site/side was marked, verified correct patient position, special equipment/implants available, medications/allergies/relevant history reviewed, required imaging and test results available.  Sterile Technique Maximal sterile technique including full sterile barrier drape, hand hygiene, sterile gown, sterile gloves, mask, hair covering, sterile ultrasound probe cover (if used).  Procedure Description Area of catheter insertion was cleaned with chlorhexidine and draped in sterile fashion.  With real-time ultrasound guidance a central venous catheter was placed into the right femoral vein. Nonpulsatile blood flow and easy flushing noted in all ports.  The catheter was sutured in place and sterile dressing applied.  Complications/Tolerance None; patient tolerated the procedure well. Chest X-ray is ordered to verify placement for internal jugular or subclavian cannulation.   Chest x-ray is not ordered for femoral cannulation.  EBL Minimal  Specimen(s) None  Michelle Gay D. Harris, NP-C [facility] Personal contact information can be found on Amion  If no contact or response made please call [phone] 03/15/2023, 6:29 PM

## 2023-03-15 NOTE — Progress Notes (Signed)
Pt transported on ventilator from 3M10 to CT and back w/o complications. RT, RN & Transport at bedside.

## 2023-03-15 NOTE — Progress Notes (Signed)
eLink Physician-Brief Progress Note Patient Name: Branisha Jerrett DOB: 03/07/1951 MRN: [id number]   Date of Service  03/15/2023  HPI/Events of Note  Received request from [facility] to give albumin 5% 500 cc, Vancomycin 1.5 g IV and cefepime 8 g q 8 Also received request to change code status to full code  eICU Interventions  Albumin, vancomycin and cefepime ordered Will defer to primary team and [facility] regarding code status given that patient has been declared brain dead.     Intervention Category Intermediate Interventions: Communication with other healthcare providers and/or family;Other:  Darl Pikes 03/15/2023, 10:45 PM

## 2023-03-15 NOTE — H&P (Signed)
NEUROLOGY H&P NOTE   Date of service: March 15, 2023 Patient Name: Michelle Gay MRN:  [id number] DOB:  [birth year] Chief Complaint: "unresponsive"  History of Present Illness  Michelle Gay is a 72 y.o. female with hx of DM2, HTN, HLD, morbid obesity who is brought in to the ED unresponsive. She was found to have unresponsive after dinner. She was given narcan with no improvement. She was last seen normal at around 0730PM. She was being bagged upon arrival to the ED and intubated. 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  Last known well: 1930 Modified rankin score: 3-Moderate disability-requires help but walks WITHOUT assistance ICH Score: 4 tNKASE: Not offered due to ICH Thrombectomy: not offered due to ICH NIHSS components Score: Comment  1a Level of Conscious 0[]  1[]  2[]  3[x]      1b LOC Questions 0[]  1[]  2[x]       1c LOC Commands 0[]  1[]  2[x]       2 Best Gaze 0[x]  1[]  2[]       3 Visual 0[]  1[]  2[x]  3[]      4 Facial Palsy 0[x]  1[]  2[]  3[]      5a Motor Arm - left 0[]  1[]  2[]  3[]  4[x]  UN[]    5b Motor Arm - Right 0[]  1[]  2[]  3[]  4[x]  UN[]    6a Motor Leg - Left 0[]  1[]  2[]  3[]  4[x]  UN[]    6b Motor Leg - Right 0[]  1[]  2[]  3[]  4[x]  UN[]    7 Limb Ataxia 0[x]  1[]  2[]  3[]  UN[]     8 Sensory 0[]  1[]  2[x]  UN[]      9 Best Language 0[]  1[]  2[]  3[x]      10 Dysarthria 0[]  1[]  2[x]  UN[]      11 Extinct. and Inattention 0[x]  1[]  2[]       TOTAL: 32      ROS   Unable to obtain review of system secondary to obtunded mentation.  Past History   Past Medical History:  Diagnosis Date   COPD (chronic obstructive pulmonary disease) (HCC)    DM2 (diabetes mellitus, type 2) (HCC)    HTN (hypertension)    MCI (mild cognitive impairment)    Morbid obesity with BMI of 60.0-69.9, adult (HCC)    History reviewed. No pertinent surgical history. Family History  Family history unknown: Yes   Social  History   Socioeconomic History   Marital status: Single    Spouse name: Not on file   Number of children: Not on file   Years of education: Not on file   Highest education level: Not on file  Occupational History   Not on file  Tobacco Use   Smoking status: Unknown   Smokeless tobacco: Never  Vaping Use   Vaping status: Never Used  Substance and Sexual Activity   Alcohol use: Not Currently   Drug use: Not Currently   Sexual activity: Not Currently  Other Topics Concern   Not on file  Social History Narrative   Not on file   Social Determinants of Health   Financial Resource Strain: Not on file  Food Insecurity: No Food Insecurity (09/20/2022)   Hunger Vital Sign    Worried About Running Out of Food in the Last Year: Never true    Ran Out of Food in the Last Year: Never true  Transportation Needs: No Transportation Needs (09/20/2022)   PRAPARE - Transportation    Lack of Transportation (Medical): No    Lack of  Transportation (Non-Medical): No  Physical Activity: Not on file  Stress: Not on file  Social Connections: Not on file   No Known Allergies  Medications   Medications Prior to Admission  Medication Sig Dispense Refill Last Dose   acetaminophen (TYLENOL) 500 MG tablet Take 1,000 mg by mouth 2 (two) times daily as needed for mild pain (pain score 1-3) or headache.   UNK   losartan (COZAAR) 25 MG tablet Take 25 mg by mouth daily.      nitrofurantoin, macrocrystal-monohydrate, (MACROBID) 100 MG capsule Take 100 mg by mouth 2 (two) times daily.      albuterol (VENTOLIN HFA) 108 (90 Base) MCG/ACT inhaler Inhale 2 puffs into the lungs every 6 (six) hours as needed for shortness of breath.      aspirin 81 MG chewable tablet Chew 81 mg by mouth daily.      aspirin-acetaminophen-caffeine (EXCEDRIN MIGRAINE) 250-250-65 MG tablet Take 1 tablet by mouth daily as needed for headache or migraine.      atorvastatin (LIPITOR) 10 MG tablet Take 10 mg by mouth at bedtime.       Benzocaine-Menthol (CEPACOL SORE THROAT) 10-2.1 MG LOZG Use as directed 1 lozenge in the mouth or throat daily.      budesonide (PULMICORT) 0.5 MG/2ML nebulizer solution Take 0.5 mg by nebulization 2 times daily at 12 noon and 4 pm.      calcium carbonate (TUMS - DOSED IN MG ELEMENTAL CALCIUM) 500 MG chewable tablet Chew 2 tablets by mouth daily.      Cholecalciferol 50 MCG (2000 UT) TABS Take 2,000 Units by mouth daily.      cloNIDine (CATAPRES) 0.1 MG tablet Take 0.1 mg by mouth every 12 (twelve) hours as needed (hypertension). If SBP > 160 or DBP >100. If BP >160/100 1 hour after administration call MD.      cycloSPORINE (RESTASIS) 0.05 % ophthalmic emulsion Place 2 drops into both eyes 2 (two) times daily.      diclofenac Sodium (VOLTAREN) 1 % GEL Apply 4 g topically daily. To bilateral hands      DULoxetine (CYMBALTA) 60 MG capsule Take 60 mg by mouth daily.      famotidine (PEPCID) 20 MG tablet Take 20 mg by mouth daily.      feeding supplement (ENSURE ENLIVE / ENSURE PLUS) LIQD Take 237 mLs by mouth 3 (three) times daily between meals. (Patient not taking: Reported on 12/08/2022) 237 mL 12    ferrous sulfate 325 (65 FE) MG tablet Take 325 mg by mouth daily with breakfast.      fluticasone (FLONASE) 50 MCG/ACT nasal spray Place 1 spray into both nostrils daily.      folic acid (FOLVITE) 1 MG tablet Take 1 mg by mouth daily.      gabapentin (NEURONTIN) 800 MG tablet Take 800 mg by mouth 2 (two) times daily.      ipratropium (ATROVENT) 0.02 % nebulizer solution Take 0.5 mg by nebulization in the morning, at noon, and at bedtime.      lidocaine (HM LIDOCAINE PATCH) 4 % Place 0.5 patches onto the skin daily.      Menthol, Topical Analgesic, (BIOFREEZE PROFESSIONAL) 5 % GEL Apply 1 Application topically in the morning and at bedtime. To left arm & shoulder      methotrexate (RHEUMATREX) 2.5 MG tablet Take 5 mg by mouth once a week. Caution:Chemotherapy. Protect from light. Tuesdays      montelukast  (SINGULAIR) 10 MG tablet Take 10 mg by  mouth daily.      oxyCODONE (OXY IR/ROXICODONE) 5 MG immediate release tablet Take 1 tablet (5 mg total) by mouth 2 (two) times daily. 10 tablet 0    OZEMPIC, 0.25 OR 0.5 MG/DOSE, 2 MG/3ML SOPN Inject 0.5 mg into the skin once a week. Tuesdays      polyethylene glycol (MIRALAX / GLYCOLAX) 17 g packet Take 17 g by mouth daily.      sennosides-docusate sodium (SENOKOT-S) 8.6-50 MG tablet Take 2 tablets by mouth daily.      tiZANidine (ZANAFLEX) 4 MG tablet Take 2 mg by mouth in the morning and at bedtime.      topiramate (TOPAMAX) 25 MG tablet Take 25 mg by mouth 2 (two) times daily.      Zinc Oxide (BAZA PROTECT MOISTURE BARRIER EX) Apply 1 Application topically in the morning and at bedtime.        Vitals   Vitals:   03/15/23 0430 03/15/23 0445 03/15/23 0500 03/15/23 0515  BP: 139/79 (!) 140/79 133/76 (!) 142/73  Pulse: (!) 58 (!) 57 (!) 56 (!) 57  Resp: (!) 24 (!) 24 (!) 24 (!) 24  Temp: (!) 94.3 F (34.6 C) (!) 94.3 F (34.6 C) (!) 94.1 F (34.5 C) (!) 94.1 F (34.5 C)  TempSrc: Bladder Bladder    SpO2: 100% 100% 100% 100%  Weight:      Height:         Body mass index is 64.02 kg/m.  Physical Exam   Constitutional: Appears well-developed and well-nourished.  Eyes: No scleral injection.  HENT: No OP obstruction.  Head: Normocephalic.  Cardiovascular: Normal rate and regular rhythm.  Respiratory: not breathing over vent.  GI: Soft.  No distension. There is no tenderness.  Skin: WDI.   Neurologic Examination performed off any sedation  Mental status/Cognition: obtunded with no response to voice or loud clap or to noxious stimuli Speech/language: mute no speech, no attempts to communicate. Cranial nerves:   CN II Pupils are dilated and fixed BL.   CN III,IV,VI Dolls eyes reflex is absent   CN V Corneals absent   CN VII Facial diplegia   CN VIII No responsve to voice   CN IX & X Weak cough, no gag.   CN XI No shoulder ovement.    CN XII Tongue midline.   Sensory/Motor:  No movement to noxious stimuli in any extremities.  Coordination/Complex Motor:  Unable to assess.  Labs   CBC:  Recent Labs  Lab 03/14/23 2309 03/15/23 0155  WBC 7.1  --   NEUTROABS 5.0  --   HGB 11.6* 13.6  HCT 38.5 40.0  MCV 91.2  --   PLT 286  --     Basic Metabolic Panel:  Lab Results  Component Value Date   NA 144 03/15/2023   K 3.8 03/15/2023   CO2 24 03/15/2023   GLUCOSE 173 (H) 03/15/2023   BUN 17 03/15/2023   CREATININE 1.12 (H) 03/15/2023   CALCIUM 8.8 (L) 03/15/2023   GFRNONAA 53 (L) 03/15/2023   Lipid Panel: No results found for: "LDLCALC" HgbA1c:  Lab Results  Component Value Date   HGBA1C 6.2 (H) 03/15/2023   Urine Drug Screen: No results found for: "LABOPIA", "COCAINSCRNUR", "LABBENZ", "AMPHETMU", "THCU", "LABBARB"  Alcohol Level No results found for: "ETH" INR  Lab Results  Component Value Date   INR 1.2 12/08/2022   APTT  Lab Results  Component Value Date   APTT 33 12/08/2022  CT Head without contrast(Personally reviewed): Large L frontal IPH with 20mm MLS with intraventricular extension.  Impression   Michelle Gay is a 72 y.o. 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 Suspect primary hemorrhage vs hemorrhagic transformation of stroke.  Unfortunately, with an ICH score of 4 and poor exam, this is devstating injury and not survivable. I spoke with niece who is listed as the emergency contact. Niece unfortunately, is not sure if she should be the one making decisions and thus patient is being admitted overnight and will continue with maximal medical management. She is not a candidate for decompression.  Recommendations  Large L Frontal IPH: - Admit to ICU - Stability scan in 6 hours or STAT with any neurological decline - Frequent neuro checks; q82min for 1 hour, then q1hour - No antiplatelets or anticoagulants due to  ICH - SCD for DVT prophylaxis, pharmacological DVT ppx at 24 hours if ICH is stable - Blood pressure control with goal systolic 130 - 150, cleverplex and labetalol PRN - Stroke labs, HgbA1c, fasting lipid panel - MRI brain with and without contrast when stabilized to evaluate for underlying mass - MRA without contrast of the brain and Vasc US carotid duplex to evaluate for underlying vascular abnormality. - Risk factor modification - Echocardiogram - PT consult, OT consult, Speech consult. - Stroke team to follow - hypertonic saline 250cc bolus, followed by 75cc/hr with goal sodium of 150-155.  ______________________________________________________________________   This patient is critically ill and at significant risk of neurological worsening, death and care requires constant monitoring of vital signs, hemodynamics,respiratory and cardiac monitoring, neurological assessment, discussion with family, other specialists and medical decision making of high complexity. I spent 60 minutes of neurocritical care time  in the care of  this patient. This was time spent independent of any time provided by nurse practitioner or PA.  Erick Blinks [facility] 03/15/2023  6:41 AM  Signed,  Erick Blinks

## 2023-03-15 NOTE — Progress Notes (Addendum)
03/15/2023 Examined. C/w brain death. D/w neuro, PCCM will take over as primary.  Myrla Halsted MD PCCM

## 2023-03-15 NOTE — Progress Notes (Signed)
Apnea test performed per protocol w/ Adaline Sill, MD. Following test, pt placed back on vent at documented settings.   [id number] 100% 111/70 0933-HR75 98% 112/62

## 2023-03-15 NOTE — Procedures (Signed)
Bedside Bronchoscopy Procedure Note Michelle Gay [id number] 1950-08-18  Procedure: Bronchoscopy Indications:  organ donation  Procedure Details: ET Tube Size: ET Tube secured at lip (cm): Bite block in place: No In preparation for procedure, Patient hyper-oxygenated with 100 % FiO2 Airway entered and the following bronchi were examined: RUL.   Bronchoscope removed.    Evaluation BP (!) 95/52   Pulse 79   Temp 100 F (37.8 C) Comment: procedure  Resp (!) 24   Ht 5\' 2"  (1.575 m)   Wt (!) 158.8 kg   SpO2 99%   BMI 64.02 kg/m  Breath Sounds:Diminished O2 sats: stable throughout Patient's Current Condition: stable Specimens:  labeled L and R with pt sticker and given to RN Complications: No apparent complications Patient did tolerate procedure well.   [name] 03/15/2023, 7:09 PM

## 2023-03-15 NOTE — Procedures (Signed)
Arterial Catheter Insertion Procedure Note  Ruthanne Wolfley  [id number]  [birth year]  Date:03/15/23  Time:6:29 PM    Provider Performing: Alphonzo Lemmings D. Harris    Procedure: Insertion of Arterial Line (40981) with US guidance (19147)   Indication(s) Blood pressure monitoring and/or need for frequent ABGs  Consent Unable to obtain consent due to emergent nature of procedure.  Anesthesia None   Time Out Verified patient identification, verified procedure, site/side was marked, verified correct patient position, special equipment/implants available, medications/allergies/relevant history reviewed, required imaging and test results available.   Sterile Technique Maximal sterile technique including full sterile barrier drape, hand hygiene, sterile gown, sterile gloves, mask, hair covering, sterile ultrasound probe cover (if used).   Procedure Description Area of catheter insertion was cleaned with chlorhexidine and draped in sterile fashion. With real-time ultrasound guidance an arterial catheter was placed into the right femoral artery.  Appropriate arterial tracings confirmed on monitor.     Complications/Tolerance None; patient tolerated the procedure well.   EBL Minimal   Specimen(s) None  Evora Schechter D. Harris, NP-C [facility] Personal contact information can be found on Amion  If no contact or response made please call [phone] 03/15/2023, 6:30 PM

## 2023-03-15 NOTE — Procedures (Signed)
Arterial Catheter Insertion Procedure Note  Michelle Gay  [id number]  [birth year]  Date:03/15/23  Time:11:34 PM    Provider Performing: Roslynn Amble    Procedure: Insertion of Arterial Line (08657) with US guidance (84696)   Indication(s) Blood pressure monitoring and/or need for frequent ABGs  Consent Unable to obtain consent due to emergent nature of procedure.  Anesthesia None   Time Out Verified patient identification, verified procedure, site/side was marked, verified correct patient position, special equipment/implants available, medications/allergies/relevant history reviewed, required imaging and test results available.   Sterile Technique Maximal sterile technique including full sterile barrier drape, hand hygiene, sterile gown, sterile gloves, mask, hair covering, sterile ultrasound probe cover (if used).   Procedure Description Area of catheter insertion was cleaned with chlorhexidine and draped in sterile fashion. Initially attempted to insert catheter into left radial artery but despite arterial blood flow the guidewire could not be inserted. The procedure was then switched to the left femoral artery. With real-time ultrasound guidance an arterial catheter was placed into the left femoral artery.  Appropriate arterial tracings confirmed on monitor.     Complications/Tolerance None; patient tolerated the procedure well.   EBL Minimal   Specimen(s) None

## 2023-03-15 NOTE — Progress Notes (Signed)
RN and myself transported patient on vent from ED to 3M10 without complications

## 2023-03-15 NOTE — Progress Notes (Signed)
eLink Physician-Brief Progress Note Patient Name: Michelle Gay DOB: [birth year] MRN: [id number]   Date of Service  03/15/2023  HPI/Events of Note  72 yr old female NHR found unresponsive and transported to the ED.  Patient required intubation.  CT head revealed large intraparenchymal ICH with evidence of midline shift and herniation.  Family en route to hospital.    eICU Interventions  Chart reviewed     Intervention Category Evaluation Type: New Patient Evaluation  Henry Russel, P 03/15/2023, 3:43 AM

## 2023-03-15 NOTE — Consult Note (Signed)
NAME:  Michelle Gay, MRN:  [id number], DOB:  1950-12-22, LOS: 0 ADMISSION DATE:  03/14/2023, CONSULTATION DATE:  03/15/23 REFERRING MD:  Roxy Horseman PA, CHIEF COMPLAINT:  ICH   History of Present Illness:  History obtained from the ED provider and electronic medical record as the patient arrived with altered mentation and is currently intubated.  Has a known history of COPD and hypertension.  Patient was transported to the emergency department around 10:42 PM this evening from her skilled facility by EMS.  Patient was found at 915 after dinner unresponsive.  Notably does take pain medication and Narcan was administered without improvement.  Patient was documented as normal around 7:30 PM.  Upon EMS arrival patient was unresponsive.  Patient arrived to the emergency department on nonrebreather mask with sonorous respirations.  With altered mental status patient was also noted to have no gag reflex.  Patient was subsequently endotracheally intubated by ED provider.  In the emergency department the patient's blood pressure has fluctuated.  She has also been somewhat hypothermic.  CT imaging of the brain revealed a "massive intraparenchymal hematoma" centered in the left frontal lobe resulting in rightward midline shift with subfalcine and left uncal herniation as well as extensive edema and hemorrhage within the fourth ventricle.  Patient was evaluated by neurology.  Staff as well as myself have attempted to contact multiple family members.  I personally called Meriel Flavors [phone]), Wonda Amis [phone]), and Barbara Cower [phone]).  Unfortunately I was only able to leave a message with the first and last telephone numbers.  Ultimately Ms. Maisie Fus did call back and spoke with someone in the emergency department.  Family are en route to our facility but arrival time is unclear.  Family did not wish to discuss CODE STATUS or goals of care until they arrived per ED provider report. PCCM  asked to consult and assist neurology with the patient management as she is now intubated and we are awaiting family arrival to discuss goals of care. At the time of my evaluation patient had remained off of sedation since endotracheal intubation around 11:30 PM this evening.  Patient was hypotensive requiring peripherally infuse Levophed to maintain her mean arterial pressure.  Significant Hospital Events: Including procedures, antibiotic start and stop dates in addition to other pertinent events   10/26 - Presented to hospital with AMS >> Intubated by EDP  Interim History / Subjective:  N/A  Objective   Blood pressure (!) 221/133, pulse 71, temperature (!) 95.5 F (35.3 C), resp. rate 19, height 5\' 2"  (1.575 m), weight (!) 158.8 kg, SpO2 100%.    Vent Mode: PRVC FiO2 (%):  [50 %] 50 % Set Rate:  [20 bmp] 20 bmp Vt Set:  [400 mL] 400 mL PEEP:  [5 cmH20] 5 cmH20 Plateau Pressure:  [19 cmH20] 19 cmH20  No intake or output data in the 24 hours ending 03/15/23 0204 Filed Weights   03/14/23 2246  Weight: (!) 158.8 kg    Examination: General:  Elderly, obese female. No distress. No family at bedside.  Integument:  Warm & dry. No rash on exposed skin.  Tattoo noted on chest.  Extremities:  No cyanosis or clubbing.  Lymphatics:  No appreciated cervical or supraclavicular lymphadenoapthy. HEENT:  Moist mucus membranes. No scleral injection or icterus. Endotracheal tube in place. Cardiovascular:  Regular rate. Regular rhythm.  Lower extremity edema noted.  No appreciable JVD given body habitus. Pulmonary:  Good aeration. Clear to auscultation bilaterally. Symmetric chest wall expansion on  ventilator. Abdomen: Soft. Hypoactive bowel sounds. Protuberant.  Musculoskeletal:  Normal bulk. No joint deformity or effusion appreciated. Neurological:  Examined on no sedation. Pupils dilated and fixed. No spontaneous movements.  No withdrawal to pain in extremities.  No cough or gag reflex.  No  corneal reflex.  Deep tendon reflexes are 0.  Patient notably does have spontaneous respirations when switched to pressure support briefly. Psychiatric:  Unable to assess with altered mental status.  Assessment & Plan:  72 y.o. female with documented past medical history of diabetes mellitus type 2 and hypertension.  Patient presenting with last known well time of 7:30 PM on 10/26.  On presentation patient had sonorous respirations and altered mental status requiring emergent endotracheal intubation by ED provider.  Subsequently found to have intracerebral hemorrhage with evidence for herniation.  Eventually after multiple attempts by multiple providers we were able to reach family who are reportedly en route to our facility.  1.  Left intracerebral hemorrhage: Present on arrival.  CT imaging shows midline shift, intraventricular extension, and herniation.  Neurology consulted on arrival.  Monitoring patient with neurochecks every hour.  Plan of care as per neurology.  Bolus 3% hypertonic saline has been ordered along with a hypertonic saline infusion at 75 cc/h.  Monitoring serum sodium every 6 hours.  2.  Shock: Likely secondary to herniation.  Monitoring vitals per unit protocol.  Utilizing peripherally infused Levophed to maintain mean arterial pressure greater than 65.  Deferring placement of central venous catheter at this time.  3.  Acute hypercarbic respiratory failure: Present on arrival.  Secondary to intracerebral hemorrhage and altered mental status.  I cannot rule out some baseline element of obesity hypoventilation syndrome given her body habitus.  Targeting tidal volume of 8 cc/kg of ideal body weight.  Repeating ABG at 5 AM.  Continuous pulse oximetry monitoring is ongoing.  4.  Lactic acidosis: Likely secondary to shock in the setting of herniation.  Maintaining normotension with vasopressor infusion.  Repeating lactic acid at 5 AM.  5.  Diabetes mellitus: Checking hemoglobin A1c.   Monitoring blood glucose with Accu-Cheks every 4 hours scheduled.  Treating with low-dose sliding scale insulin as per algorithm.  6.  Hypokalemia: Replacing with KCl 40 mEq via tube x1.  Continuing to monitor electrolytes and also monitoring patient on telemetry.  7.  Hypertension: Holding home antihypertensive regimen in the setting of shock.  Monitoring vitals per unit protocol.  8.  COPD: No clear signs of acute exacerbation.  Holding on inhaled bronchodilator therapy at this time.  9.  CODE STATUS: Despite multiple attempts I have been unable to reach family to discuss CODE STATUS.  Reportedly family are en route to our facility and we will plan on addressing goals of care as well as CODE STATUS upon their arrival.  10.  Disposition: Patient to be admitted to the neuro ICU for further treatment and stabilization pending family arrival.  Remainder of care as per primary service and other consultants.  Best Practice (right click and "Reselect all SmartList Selections" daily)   Diet/type: NPO DVT prophylaxis: not indicated GI prophylaxis: PPI Lines: N/A Foley:  N/A Code Status:  full code Last date of multidisciplinary goals of care discussion [Pending]  Labs   CBC: Recent Labs  Lab 03/14/23 2307 03/14/23 2308 03/14/23 2309 03/15/23 0155  WBC  --   --  7.1  --   NEUTROABS  --   --  5.0  --   HGB 13.6 13.3 11.6* 13.6  HCT 40.0 39.0 38.5 40.0  MCV  --   --  91.2  --   PLT  --   --  286  --     Basic Metabolic Panel: Recent Labs  Lab 03/14/23 2307 03/14/23 2308 03/14/23 2309 03/15/23 0155  NA 143 142 141 144  K 3.7 3.9 3.3* 3.4*  CL 106  --  107  --   CO2  --   --  23  --   GLUCOSE 232*  --  214*  --   BUN 17  --  15  --   CREATININE 1.00  --  0.98  --   CALCIUM  --   --  8.7*  --    GFR: Estimated Creatinine Clearance: 77.8 mL/min (by C-G formula based on SCr of 0.98 mg/dL). Recent Labs  Lab 03/14/23 2309 03/15/23 0143  WBC 7.1  --   LATICACIDVEN 2.2*  3.2*    Liver Function Tests: No results for input(s): "AST", "ALT", "ALKPHOS", "BILITOT", "PROT", "ALBUMIN" in the last 168 hours. No results for input(s): "LIPASE", "AMYLASE" in the last 168 hours. No results for input(s): "AMMONIA" in the last 168 hours.  ABG    Component Value Date/Time   PHART 7.334 (L) 03/15/2023 0155   PCO2ART 50.1 (H) 03/15/2023 0155   PO2ART 215 (H) 03/15/2023 0155   HCO3 27.2 03/15/2023 0155   TCO2 29 03/15/2023 0155   ACIDBASEDEF 1.0 03/14/2023 2308   O2SAT 100 03/15/2023 0155     Coagulation Profile: No results for input(s): "INR", "PROTIME" in the last 168 hours.  Cardiac Enzymes: No results for input(s): "CKTOTAL", "CKMB", "CKMBINDEX", "TROPONINI" in the last 168 hours.  HbA1C: Hgb A1c MFr Bld  Date/Time Value Ref Range Status  12/08/2022 05:22 PM 6.6 (H) 4.8 - 5.6 % Final    Comment:    (NOTE) Pre diabetes:          5.7%-6.4%  Diabetes:              >6.4%  Glycemic control for   <7.0% adults with diabetes     CBG: No results for input(s): "GLUCAP" in the last 168 hours.  IMAGING: PORT CXR 03/14/23 (personally reviewed by me):   Endotracheal tube approximately 2 cm from the carina.  No bowel fullness and right hilum and right lower lobe adjacent to the cardiac border.  Question possible consolidation.  Film rotated slightly to the right but trachea midline.  Normal mediastinal contour.  No pleural effusion.  No pneumothorax.  Enteric feeding tube courses below the diaphragm.  CT HEAD W/O CONTRAST 03/14/23 (per radiologist):   IMPRESSION: 1. Massive intraparenchymal hematoma centered in the left frontal lobe measuring 9.8 x 4.7 x 4.5 cm (103 mL). This causes 20 mm of rightward midline shift, subfalcine herniation and left uncal herniation. 2. Blood within the fourth ventricle.  Review of Systems:   Unable to obtain a review of systems given patient's altered mental status.  Past Medical History:   Past Medical History:  Diagnosis  Date   COPD (chronic obstructive pulmonary disease) (HCC)    DM2 (diabetes mellitus, type 2) (HCC)    HTN (hypertension)    MCI (mild cognitive impairment)    Morbid obesity with BMI of 60.0-69.9, adult (HCC)    Surgical History:  Unable to obtain a surgical history given patient's altered mental status.  Social History:   Social History   Socioeconomic History   Marital status: Single    Spouse name: Not on  file   Number of children: Not on file   Years of education: Not on file   Highest education level: Not on file  Occupational History   Not on file  Tobacco Use   Smoking status: Unknown   Smokeless tobacco: Never  Vaping Use   Vaping status: Never Used  Substance and Sexual Activity   Alcohol use: Not Currently   Drug use: Not Currently   Sexual activity: Not Currently  Other Topics Concern   Not on file  Social History Narrative   Not on file   Social Determinants of Health   Financial Resource Strain: Not on file  Food Insecurity: No Food Insecurity (09/20/2022)   Hunger Vital Sign    Worried About Running Out of Food in the Last Year: Never true    Ran Out of Food in the Last Year: Never true  Transportation Needs: No Transportation Needs (09/20/2022)   PRAPARE - Administrator, Civil Service (Medical): No    Lack of Transportation (Non-Medical): No  Physical Activity: Not on file  Stress: Not on file  Social Connections: Not on file    Family History:  Unable to obtain a family medical history given patient's altered mental status.  Allergies No Known Allergies   Home Medications  Prior to Admission medications   Medication Sig Start Date End Date Taking? Authorizing Provider  acetaminophen (TYLENOL) 500 MG tablet Take 1,000 mg by mouth 2 (two) times daily as needed for mild pain (pain score 1-3) or headache.   Yes [provider]  losartan (COZAAR) 25 MG tablet Take 25 mg by mouth daily. 02/10/23  Yes [provider]   nitrofurantoin, macrocrystal-monohydrate, (MACROBID) 100 MG capsule Take 100 mg by mouth 2 (two) times daily. 03/09/23  Yes [provider]  albuterol (VENTOLIN HFA) 108 (90 Base) MCG/ACT inhaler Inhale 2 puffs into the lungs every 6 (six) hours as needed for shortness of breath. 09/01/22   [provider]  aspirin 81 MG chewable tablet Chew 81 mg by mouth daily.    [provider]  aspirin-acetaminophen-caffeine (EXCEDRIN MIGRAINE) [phone] MG tablet Take 1 tablet by mouth daily as needed for headache or migraine.    [provider]  atorvastatin (LIPITOR) 10 MG tablet Take 10 mg by mouth at bedtime. 08/10/22   [provider]  Benzocaine-Menthol (CEPACOL SORE THROAT) 10-2.1 MG LOZG Use as directed 1 lozenge in the mouth or throat daily.    [provider]  budesonide (PULMICORT) 0.5 MG/2ML nebulizer solution Take 0.5 mg by nebulization 2 times daily at 12 noon and 4 pm. 09/09/22   [provider]  calcium carbonate (TUMS - DOSED IN MG ELEMENTAL CALCIUM) 500 MG chewable tablet Chew 2 tablets by mouth daily.    [provider]  Cholecalciferol 50 MCG (2000 UT) TABS Take 2,000 Units by mouth daily.    [provider]  cloNIDine (CATAPRES) 0.1 MG tablet Take 0.1 mg by mouth every 12 (twelve) hours as needed (hypertension). If SBP > 160 or DBP >100. If BP >160/100 1 hour after administration call MD.    [provider]  cycloSPORINE (RESTASIS) 0.05 % ophthalmic emulsion Place 2 drops into both eyes 2 (two) times daily. 07/26/22   [provider]  diclofenac Sodium (VOLTAREN) 1 % GEL Apply 4 g topically daily. To bilateral hands 07/04/22   [provider]  DULoxetine (CYMBALTA) 60 MG capsule Take 60 mg by mouth daily. 08/10/22   [provider]  famotidine (PEPCID) 20 MG tablet Take 20 mg by mouth daily. 08/10/22   [provider]  feeding supplement (ENSURE ENLIVE / ENSURE PLUS) LIQD  Take 237 mLs by mouth 3 (three) times daily between meals. Patient not taking: Reported on 12/08/2022 09/20/22   Champ Mungo, DO  ferrous sulfate 325 (65 FE) MG tablet Take 325 mg by mouth daily with breakfast.    [provider]  fluticasone (FLONASE) 50 MCG/ACT nasal spray Place 1 spray into both nostrils daily.    [provider]  folic acid (FOLVITE) 1 MG tablet Take 1 mg by mouth daily.    [provider]  gabapentin (NEURONTIN) 800 MG tablet Take 800 mg by mouth 2 (two) times daily. 08/10/22   [provider]  ipratropium (ATROVENT) 0.02 % nebulizer solution Take 0.5 mg by nebulization in the morning, at noon, and at bedtime. 09/03/22   [provider]  lidocaine (HM LIDOCAINE PATCH) 4 % Place 0.5 patches onto the skin daily.    [provider]  Menthol, Topical Analgesic, (BIOFREEZE PROFESSIONAL) 5 % GEL Apply 1 Application topically in the morning and at bedtime. To left arm & shoulder    [provider]  methotrexate (RHEUMATREX) 2.5 MG tablet Take 5 mg by mouth once a week. Caution:Chemotherapy. Protect from light. Tuesdays    [provider]  montelukast (SINGULAIR) 10 MG tablet Take 10 mg by mouth daily. 08/10/22   [provider]  oxyCODONE (OXY IR/ROXICODONE) 5 MG immediate release tablet Take 1 tablet (5 mg total) by mouth 2 (two) times daily. 12/15/22   Elgergawy, Leana Roe, MD  OZEMPIC, 0.25 OR 0.5 MG/DOSE, 2 MG/3ML SOPN Inject 0.5 mg into the skin once a week. Tuesdays 08/13/22   [provider]  polyethylene glycol (MIRALAX / GLYCOLAX) 17 g packet Take 17 g by mouth daily.    [provider]  sennosides-docusate sodium (SENOKOT-S) 8.6-50 MG tablet Take 2 tablets by mouth daily.    [provider]  tiZANidine (ZANAFLEX) 4 MG tablet Take 2 mg by mouth in the morning and at bedtime. 08/10/22   [provider]  topiramate (TOPAMAX) 25 MG tablet Take 25 mg by mouth 2 (two) times  daily. 08/10/22   [provider]  Zinc Oxide (BAZA PROTECT MOISTURE BARRIER EX) Apply 1 Application topically in the morning and at bedtime.    [provider]     Critical care time:  I have spent a total of 33 minutes of critical care time today managing the patient's acute encephalopathy secondary to intracerebral hemorrhage with intraventricular extension and evidence for herniation on CT imaging requiring emergent endotracheal intubation to protect her airway and full support for mechanical ventilation with acute hypercarbic respiratory failure independent of time spent during procedures caring for the patient, discussing plan of care with ED provider, and reviewing the patient's electronic medical record.  The patient's prognosis is dismal.  It is highly unlikely she will survive this critical illness.    Donna Christen Jamison Neighbor, M.D. [facility] 2:13 AM 03/15/23   Please see Amion for pager details.  From 7A-7P if no response please call [phone] After hours, please call ELink [phone]

## 2023-03-15 NOTE — Procedures (Signed)
Adult Brain Death Determination  Time of Examination: 03/15/23 9:55 AM  No Evidence of /Cause of Reversible CNS Depression  Core temperature must be greater >36 degrees. Last temp: Temp: (!) 97.2 F (36.2 C) (Note: If unable to achieve normothermia after 12 hours of temperature management, may consider proceeding with Brain Death Evaluation.):    yes  Evidence of severe metabolic perturbations that could potentate CNS depression. Consider glucose, Na, creatinine, PaCO2, SaO2.:    Absent  Evidence of drugs, by history or measurement, that could potentiate central nervous system depression: narcotics, ethanol, benzodiazepines, barbiturates, neuromuscular blockade.:     Absent  Absence of Cortical Function  GCS = 3:    yes  Absence of Brain Stem Reflexes and Responses  Pupils light-fixed    yes  Corneal reflexes:    Absent  Response to upper and lower airway stimulation, such as pharyngeal and endotracheal suctioning.:    Absent  Ocular response to head turning (eye movement).    Absent  Absence of Spontaneous Respirations  (Apnea test performed per Brain Death Policy. If not met due to hemodynamic/ventilatory instability, then perform EEG, TCD, or cerebral blow flow studies.)  1.   Spontaneous Respirations   Absent  2.   PaCO2 at start of apnea test:  44  3.   PaCO2 at end of apnea test:  71  4.   CO2 rise of 20 or greater from baseline:   yes  Document Confirmatory Test Utilized: (Optional) Nuclear cerebral flow, cerebral angiography (CT/MR angio), transcranial Doppler ultrasound, EEG, SSEP (record results).  Test results (if available):  CT head 03/15/23 large IPH with herniation  Patient pronounced dead by neurological criteria at 0953 AM on 03/15/23 .  Lorin Glass, MD 03/15/2023 9:55 AM

## 2023-03-16 ENCOUNTER — Inpatient Hospital Stay (HOSPITAL_COMMUNITY): Payer: Medicare Other

## 2023-03-16 DIAGNOSIS — I611 Nontraumatic intracerebral hemorrhage in hemisphere, cortical: Secondary | ICD-10-CM

## 2023-03-16 LAB — BLOOD CULTURE ID PANEL (REFLEXED) - BCID2

## 2023-03-16 LAB — POCT I-STAT 7, (LYTES, BLD GAS, ICA,H+H)
Acid-base deficit: 4 mmol/L — ABNORMAL HIGH (ref 0.0–2.0)
Acid-base deficit: 5 mmol/L — ABNORMAL HIGH (ref 0.0–2.0)
Acid-base deficit: 4 mmol/L — ABNORMAL HIGH (ref 0.0–2.0)
Acid-base deficit: 5 mmol/L — ABNORMAL HIGH (ref 0.0–2.0)
Acid-base deficit: 4 mmol/L — ABNORMAL HIGH (ref 0.0–2.0)
Bicarbonate: 21.4 mmol/L (ref 20.0–28.0)
Bicarbonate: 19.3 mmol/L — ABNORMAL LOW (ref 20.0–28.0)
Bicarbonate: 20.6 mmol/L (ref 20.0–28.0)
Bicarbonate: 19.7 mmol/L — ABNORMAL LOW (ref 20.0–28.0)
Bicarbonate: 20.9 mmol/L (ref 20.0–28.0)
Calcium, Ion: 1.29 mmol/L (ref 1.15–1.40)
Calcium, Ion: 1.21 mmol/L (ref 1.15–1.40)
Calcium, Ion: 1.3 mmol/L (ref 1.15–1.40)
Calcium, Ion: 1.23 mmol/L (ref 1.15–1.40)
Calcium, Ion: 1.3 mmol/L (ref 1.15–1.40)
HCT: 32 % — ABNORMAL LOW (ref 36.0–46.0)
HCT: 28 % — ABNORMAL LOW (ref 36.0–46.0)
HCT: 29 % — ABNORMAL LOW (ref 36.0–46.0)
HCT: 27 % — ABNORMAL LOW (ref 36.0–46.0)
HCT: 28 % — ABNORMAL LOW (ref 36.0–46.0)
Hemoglobin: 10.9 g/dL — ABNORMAL LOW (ref 12.0–15.0)
Hemoglobin: 9.5 g/dL — ABNORMAL LOW (ref 12.0–15.0)
Hemoglobin: 9.9 g/dL — ABNORMAL LOW (ref 12.0–15.0)
Hemoglobin: 9.2 g/dL — ABNORMAL LOW (ref 12.0–15.0)
Hemoglobin: 9.5 g/dL — ABNORMAL LOW (ref 12.0–15.0)
O2 Saturation: 100 %
O2 Saturation: 100 %
O2 Saturation: 99 %
O2 Saturation: 100 %
O2 Saturation: 100 %
Patient temperature: 36.3
Patient temperature: 35
Patient temperature: 36.3
Patient temperature: 37.6
Patient temperature: 37.4
Potassium: 3.5 mmol/L (ref 3.5–5.1)
Potassium: 3.1 mmol/L — ABNORMAL LOW (ref 3.5–5.1)
Potassium: 3.5 mmol/L (ref 3.5–5.1)
Potassium: 3.3 mmol/L — ABNORMAL LOW (ref 3.5–5.1)
Potassium: 3.4 mmol/L — ABNORMAL LOW (ref 3.5–5.1)
Sodium: 159 mmol/L — ABNORMAL HIGH (ref 135–145)
Sodium: 161 mmol/L (ref 135–145)
Sodium: 162 mmol/L (ref 135–145)
Sodium: 162 mmol/L (ref 135–145)
Sodium: 162 mmol/L (ref 135–145)
TCO2: 23 mmol/L (ref 22–32)
TCO2: 20 mmol/L — ABNORMAL LOW (ref 22–32)
TCO2: 22 mmol/L (ref 22–32)
TCO2: 21 mmol/L — ABNORMAL LOW (ref 22–32)
TCO2: 22 mmol/L (ref 22–32)
pCO2 arterial: 37 mm[Hg] (ref 32–48)
pCO2 arterial: 29.8 mm[Hg] — ABNORMAL LOW (ref 32–48)
pCO2 arterial: 34.8 mm[Hg] (ref 32–48)
pCO2 arterial: 36.1 mm[Hg] (ref 32–48)
pCO2 arterial: 36.8 mm[Hg] (ref 32–48)
pH, Arterial: 7.366 (ref 7.35–7.45)
pH, Arterial: 7.41 (ref 7.35–7.45)
pH, Arterial: 7.377 (ref 7.35–7.45)
pH, Arterial: 7.347 — ABNORMAL LOW (ref 7.35–7.45)
pH, Arterial: 7.364 (ref 7.35–7.45)
pO2, Arterial: 376 mm[Hg] — ABNORMAL HIGH (ref 83–108)
pO2, Arterial: 433 mm[Hg] — ABNORMAL HIGH (ref 83–108)
pO2, Arterial: 155 mm[Hg] — ABNORMAL HIGH (ref 83–108)
pO2, Arterial: 418 mm[Hg] — ABNORMAL HIGH (ref 83–108)
pO2, Arterial: 452 mm[Hg] — ABNORMAL HIGH (ref 83–108)

## 2023-03-16 LAB — CBC
HCT: 35.4 % — ABNORMAL LOW (ref 36.0–46.0)
HCT: 32.4 % — ABNORMAL LOW (ref 36.0–46.0)
HCT: 32.3 % — ABNORMAL LOW (ref 36.0–46.0)
HCT: 31 % — ABNORMAL LOW (ref 36.0–46.0)
Hemoglobin: 10.5 g/dL — ABNORMAL LOW (ref 12.0–15.0)
Hemoglobin: 9.7 g/dL — ABNORMAL LOW (ref 12.0–15.0)
Hemoglobin: 9.7 g/dL — ABNORMAL LOW (ref 12.0–15.0)
Hemoglobin: 9.4 g/dL — ABNORMAL LOW (ref 12.0–15.0)
MCH: 27.5 pg (ref 26.0–34.0)
MCH: 27.4 pg (ref 26.0–34.0)
MCH: 27.6 pg (ref 26.0–34.0)
MCH: 28 pg (ref 26.0–34.0)
MCHC: 29.7 g/dL — ABNORMAL LOW (ref 30.0–36.0)
MCHC: 29.9 g/dL — ABNORMAL LOW (ref 30.0–36.0)
MCHC: 30 g/dL (ref 30.0–36.0)
MCHC: 30.3 g/dL (ref 30.0–36.0)
MCV: 92.7 fL (ref 80.0–100.0)
MCV: 91.5 fL (ref 80.0–100.0)
MCV: 92 fL (ref 80.0–100.0)
MCV: 92.3 fL (ref 80.0–100.0)
Platelets: 234 10*3/uL (ref 150–400)
Platelets: 202 10*3/uL (ref 150–400)
Platelets: 210 10*3/uL (ref 150–400)
Platelets: 213 10*3/uL (ref 150–400)
RBC: 3.82 MIL/uL — ABNORMAL LOW (ref 3.87–5.11)
RBC: 3.54 MIL/uL — ABNORMAL LOW (ref 3.87–5.11)
RBC: 3.51 MIL/uL — ABNORMAL LOW (ref 3.87–5.11)
RBC: 3.36 MIL/uL — ABNORMAL LOW (ref 3.87–5.11)
RDW: 16.1 % — ABNORMAL HIGH (ref 11.5–15.5)
RDW: 16.3 % — ABNORMAL HIGH (ref 11.5–15.5)
RDW: 16.3 % — ABNORMAL HIGH (ref 11.5–15.5)
RDW: 16.6 % — ABNORMAL HIGH (ref 11.5–15.5)
WBC: 8.8 10*3/uL (ref 4.0–10.5)
WBC: 10.1 10*3/uL (ref 4.0–10.5)
WBC: 15 10*3/uL — ABNORMAL HIGH (ref 4.0–10.5)
WBC: 17.6 10*3/uL — ABNORMAL HIGH (ref 4.0–10.5)
nRBC: 0 % (ref 0.0–0.2)
nRBC: 0 % (ref 0.0–0.2)
nRBC: 0 % (ref 0.0–0.2)
nRBC: 0 % (ref 0.0–0.2)

## 2023-03-16 LAB — COMPREHENSIVE METABOLIC PANEL
ALT: 9 U/L (ref 0–44)
ALT: 11 U/L (ref 0–44)
ALT: 10 U/L (ref 0–44)
ALT: 12 U/L (ref 0–44)
AST: 14 U/L — ABNORMAL LOW (ref 15–41)
AST: 14 U/L — ABNORMAL LOW (ref 15–41)
AST: 14 U/L — ABNORMAL LOW (ref 15–41)
AST: 15 U/L (ref 15–41)
Albumin: 2.3 g/dL — ABNORMAL LOW (ref 3.5–5.0)
Albumin: 2.5 g/dL — ABNORMAL LOW (ref 3.5–5.0)
Albumin: 2.7 g/dL — ABNORMAL LOW (ref 3.5–5.0)
Albumin: 2.6 g/dL — ABNORMAL LOW (ref 3.5–5.0)
Alkaline Phosphatase: 83 U/L (ref 38–126)
Alkaline Phosphatase: 69 U/L (ref 38–126)
Alkaline Phosphatase: 72 U/L (ref 38–126)
Alkaline Phosphatase: 74 U/L (ref 38–126)
Anion gap: 10 (ref 5–15)
Anion gap: 10 (ref 5–15)
Anion gap: 7 (ref 5–15)
Anion gap: 9 (ref 5–15)
BUN: 16 mg/dL (ref 8–23)
BUN: 15 mg/dL (ref 8–23)
BUN: 15 mg/dL (ref 8–23)
BUN: 16 mg/dL (ref 8–23)
CO2: 20 mmol/L — ABNORMAL LOW (ref 22–32)
CO2: 20 mmol/L — ABNORMAL LOW (ref 22–32)
CO2: 20 mmol/L — ABNORMAL LOW (ref 22–32)
CO2: 20 mmol/L — ABNORMAL LOW (ref 22–32)
Calcium: 8.8 mg/dL — ABNORMAL LOW (ref 8.9–10.3)
Calcium: 9 mg/dL (ref 8.9–10.3)
Calcium: 8.5 mg/dL — ABNORMAL LOW (ref 8.9–10.3)
Calcium: 8.9 mg/dL (ref 8.9–10.3)
Chloride: 123 mmol/L — ABNORMAL HIGH (ref 98–111)
Chloride: 127 mmol/L — ABNORMAL HIGH (ref 98–111)
Chloride: 129 mmol/L — ABNORMAL HIGH (ref 98–111)
Chloride: 129 mmol/L — ABNORMAL HIGH (ref 98–111)
Creatinine, Ser: 1.26 mg/dL — ABNORMAL HIGH (ref 0.44–1.00)
Creatinine, Ser: 1.26 mg/dL — ABNORMAL HIGH (ref 0.44–1.00)
Creatinine, Ser: 1.36 mg/dL — ABNORMAL HIGH (ref 0.44–1.00)
Creatinine, Ser: 1.47 mg/dL — ABNORMAL HIGH (ref 0.44–1.00)
GFR, Estimated: 46 mL/min — ABNORMAL LOW (ref >60)
GFR, Estimated: 46 mL/min — ABNORMAL LOW (ref >60)
GFR, Estimated: 42 mL/min — ABNORMAL LOW (ref >60)
GFR, Estimated: 38 mL/min — ABNORMAL LOW (ref >60)
Glucose, Bld: 201 mg/dL — ABNORMAL HIGH (ref 70–99)
Glucose, Bld: 204 mg/dL — ABNORMAL HIGH (ref 70–99)
Glucose, Bld: 257 mg/dL — ABNORMAL HIGH (ref 70–99)
Glucose, Bld: 255 mg/dL — ABNORMAL HIGH (ref 70–99)
Potassium: 3.4 mmol/L — ABNORMAL LOW (ref 3.5–5.1)
Potassium: 3.3 mmol/L — ABNORMAL LOW (ref 3.5–5.1)
Potassium: 3.5 mmol/L (ref 3.5–5.1)
Potassium: 3.5 mmol/L (ref 3.5–5.1)
Sodium: 153 mmol/L — ABNORMAL HIGH (ref 135–145)
Sodium: 157 mmol/L — ABNORMAL HIGH (ref 135–145)
Sodium: 156 mmol/L — ABNORMAL HIGH (ref 135–145)
Sodium: 158 mmol/L — ABNORMAL HIGH (ref 135–145)
Total Bilirubin: 0.7 mg/dL (ref 0.3–1.2)
Total Bilirubin: 1 mg/dL (ref 0.3–1.2)
Total Bilirubin: 0.9 mg/dL (ref 0.3–1.2)
Total Bilirubin: 0.8 mg/dL (ref 0.3–1.2)
Total Protein: 7.3 g/dL (ref 6.5–8.1)
Total Protein: 7 g/dL (ref 6.5–8.1)
Total Protein: 7.1 g/dL (ref 6.5–8.1)
Total Protein: 7.1 g/dL (ref 6.5–8.1)

## 2023-03-16 LAB — APTT: aPTT: 37 s — ABNORMAL HIGH (ref 24–36)

## 2023-03-16 LAB — LACTIC ACID, PLASMA: Lactic Acid, Venous: 2 mmol/L (ref 0.5–1.9)

## 2023-03-16 LAB — URINALYSIS, ROUTINE W REFLEX MICROSCOPIC
Bilirubin Urine: NEGATIVE
Glucose, UA: NEGATIVE mg/dL
Ketones, ur: NEGATIVE mg/dL
Nitrite: NEGATIVE
Protein, ur: NEGATIVE mg/dL
Specific Gravity, Urine: 1.009 (ref 1.005–1.030)
WBC, UA: >50 WBC/hpf (ref 0–5)
pH: 5 (ref 5.0–8.0)

## 2023-03-16 LAB — PROTIME-INR
INR: 1.3 — ABNORMAL HIGH (ref 0.8–1.2)
INR: 1.3 — ABNORMAL HIGH (ref 0.8–1.2)
INR: 1.4 — ABNORMAL HIGH (ref 0.8–1.2)
INR: 1.4 — ABNORMAL HIGH (ref 0.8–1.2)
Prothrombin Time: 15.9 s — ABNORMAL HIGH (ref 11.4–15.2)
Prothrombin Time: 16.8 s — ABNORMAL HIGH (ref 11.4–15.2)
Prothrombin Time: 17.4 s — ABNORMAL HIGH (ref 11.4–15.2)
Prothrombin Time: 17.5 s — ABNORMAL HIGH (ref 11.4–15.2)

## 2023-03-16 LAB — GLUCOSE, CAPILLARY
Glucose-Capillary: 148 mg/dL — ABNORMAL HIGH (ref 70–99)
Glucose-Capillary: 211 mg/dL — ABNORMAL HIGH (ref 70–99)
Glucose-Capillary: 228 mg/dL — ABNORMAL HIGH (ref 70–99)
Glucose-Capillary: 236 mg/dL — ABNORMAL HIGH (ref 70–99)
Glucose-Capillary: 239 mg/dL — ABNORMAL HIGH (ref 70–99)
Glucose-Capillary: 247 mg/dL — ABNORMAL HIGH (ref 70–99)

## 2023-03-16 LAB — SARS CORONAVIRUS 2 BY RT PCR: SARS Coronavirus 2 by RT PCR: NEGATIVE

## 2023-03-16 LAB — BILIRUBIN, DIRECT
Bilirubin, Direct: 0.4 mg/dL — ABNORMAL HIGH (ref 0.0–0.2)
Bilirubin, Direct: 0.3 mg/dL — ABNORMAL HIGH (ref 0.0–0.2)
Bilirubin, Direct: 0.4 mg/dL — ABNORMAL HIGH (ref 0.0–0.2)
Bilirubin, Direct: 0.3 mg/dL — ABNORMAL HIGH (ref 0.0–0.2)

## 2023-03-16 LAB — ABO/RH: ABO/RH(D): O POS

## 2023-03-16 MED ORDER — INSULIN REGULAR(HUMAN) IN NACL 100-0.9 UT/100ML-% IV SOLN
INTRAVENOUS | Status: DC
  Administered 2023-03-16: 10.5 [IU]/h via INTRAVENOUS
  Administered 2023-03-17: 22 [IU]/h via INTRAVENOUS
  Administered 2023-03-17: 13 [IU]/h via INTRAVENOUS
  Filled 2023-03-16 (×3): qty 100

## 2023-03-16 MED ORDER — DEXTROSE 50 % IV SOLN
0.0000 mL | INTRAVENOUS | Status: DC | PRN
  Administered 2023-03-17: 50 mL via INTRAVENOUS
  Filled 2023-03-16: qty 50

## 2023-03-16 MED ORDER — FREE WATER
300.0000 mL | Status: DC
  Administered 2023-03-16 – 2023-03-18 (×20): 300 mL

## 2023-03-16 MED ORDER — LACTATED RINGERS IV SOLN: INTRAVENOUS | Status: DC

## 2023-03-16 MED ORDER — GELATIN ABSORBABLE 12-7 MM EX MISC
1.0000 | Freq: Once | CUTANEOUS | Status: AC
  Administered 2023-03-16: 1 via TOPICAL
  Filled 2023-03-16: qty 1

## 2023-03-16 MED ORDER — LIDOCAINE HCL (PF) 1 % IJ SOLN: 0.0000 mL | Freq: Once | INTRAMUSCULAR | Status: DC | PRN

## 2023-03-16 MED ORDER — SODIUM CHLORIDE 0.9 % IV SOLN
2.0000 g | Freq: Two times a day (BID) | INTRAVENOUS | Status: DC
  Administered 2023-03-16 – 2023-03-18 (×4): 2 g via INTRAVENOUS
  Filled 2023-03-16 (×4): qty 12.5

## 2023-03-16 MED ORDER — DEXTROSE IN LACTATED RINGERS 5 % IV SOLN: INTRAVENOUS | Status: DC

## 2023-03-16 NOTE — Procedures (Signed)
Interventional Radiology Procedure Note  Procedure: Ultrasound guided liver biopsy   Findings: Please refer to procedural dictation for full description. 18 ga core x2 from left lobe.  Gelfoam slurry needle track embolization.  Complications: none immediate  Estimated Blood Loss: < 5 mL  Recommendations: As per ICU/Transplant team.   Marliss Coots, MD

## 2023-03-16 NOTE — Progress Notes (Signed)
Pt transported from 3M10 to cT & returned on 100% O2 w/settings as per flow sheet. Pt tol well.

## 2023-03-16 NOTE — Consult Note (Signed)
NAME:  Michelle Gay, MRN:  [id number], DOB:  10-25-1950, LOS: 1 ADMISSION DATE:  03/14/2023, CONSULTATION DATE:  03/16/23 REFERRING MD:  Roxy Horseman PA, CHIEF COMPLAINT:  ICH   History of Present Illness:  History obtained from the ED provider and electronic medical record as the patient arrived with altered mentation and is currently intubated.  Has a known history of COPD and hypertension.  Patient was transported to the emergency department around 10:42 PM this evening from her skilled facility by EMS.  Patient was found at 915 after dinner unresponsive.  Notably does take pain medication and Narcan was administered without improvement.  Patient was documented as normal around 7:30 PM.  Upon EMS arrival patient was unresponsive.  Patient arrived to the emergency department on nonrebreather mask with sonorous respirations.  With altered mental status patient was also noted to have no gag reflex.  Patient was subsequently endotracheally intubated by ED provider.  In the emergency department the patient's blood pressure has fluctuated.  She has also been somewhat hypothermic.  CT imaging of the brain revealed a "massive intraparenchymal hematoma" centered in the left frontal lobe resulting in rightward midline shift with subfalcine and left uncal herniation as well as extensive edema and hemorrhage within the fourth ventricle.  Patient was evaluated by neurology.  Staff as well as myself have attempted to contact multiple family members.  I personally called Meriel Flavors [phone]), Wonda Amis [phone]), and Barbara Cower [phone]).  Unfortunately I was only able to leave a message with the first and last telephone numbers.  Ultimately Ms. Maisie Fus did call back and spoke with someone in the emergency department.  Family are en route to our facility but arrival time is unclear.  Family did not wish to discuss CODE STATUS or goals of care until they arrived per ED provider report. PCCM  asked to consult and assist neurology with the patient management as she is now intubated and we are awaiting family arrival to discuss goals of care. At the time of my evaluation patient had remained off of sedation since endotracheal intubation around 11:30 PM this evening.  Patient was hypotensive requiring peripherally infuse Levophed to maintain her mean arterial pressure.  Significant Hospital Events: Including procedures, antibiotic start and stop dates in addition to other pertinent events   10/26 - Presented to hospital with AMS >> Intubated by EDP  Interim History / Subjective:  N/A  Objective   Blood pressure (!) 90/58, pulse 98, temperature 99.3 F (37.4 C), resp. rate (!) 30, height 5\' 2"  (1.575 m), weight 115.4 kg, SpO2 100%.    Vent Mode: PRVC FiO2 (%):  [40 %] 40 % Set Rate:  [24 bmp-30 bmp] 30 bmp Vt Set:  [400 mL] 400 mL PEEP:  [5 cmH20] 5 cmH20 Plateau Pressure:  [18 cmH20-20 cmH20] 18 cmH20   Intake/Output Summary (Last 24 hours) at 03/16/2023 1206 Last data filed at 03/16/2023 1100 Gross per 24 hour  Intake 2553.89 ml  Output 2550 ml  Net 3.89 ml   Filed Weights   03/14/23 2246 03/16/23 0400  Weight: (!) 158.8 kg 115.4 kg    Examination: General:  Elderly, obese female. No distress. No family at bedside.  Integument:  Warm & dry. No rash on exposed skin.  Tattoo noted on chest.  Extremities:  No cyanosis or clubbing.  Lymphatics:  No appreciated cervical or supraclavicular lymphadenoapthy. HEENT:  Moist mucus membranes. No scleral injection or icterus. Endotracheal tube in place. Cardiovascular:  Regular rate. Regular  rhythm.  Lower extremity edema noted.  No appreciable JVD given body habitus. Pulmonary:  Good aeration. Clear to auscultation bilaterally. Symmetric chest wall expansion on ventilator. Abdomen: Soft. Hypoactive bowel sounds. Protuberant.  Musculoskeletal:  Normal bulk. No joint deformity or effusion appreciated. Neurological:  Examined on  no sedation. Pupils dilated and fixed. No spontaneous movements.  No withdrawal to pain in extremities.  No cough or gag reflex.  No corneal reflex.  Deep tendon reflexes are 0.  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 Psychiatric:  Unable to assess with altered mental status.  Assessment & Plan:   Left intracerebral hemorrhage: Present on arrival.  CT imaging shows midline shift, intraventricular extension, and herniation.  S/p aggressive measures with HTS infusion, now brain dead. --no sedation  Shock: Likely secondary to herniation.   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  Secondary to intracerebral hemorrhage. --PRVC, VAP bundle, Stress ulcer ppx  Diabetes mellitus:  --SSI  Hypokalemia:  --replace as needed  Hypertension:  --Holding home antihypertensive regimen in the setting of shock  Skin lesion: --punch biopsy 10/28 per [facility] recommendation  Liver enzyme elevation: --liver biopsy per [facility] recommendation  Best Practice (right click and "Reselect all SmartList Selections" daily)   Diet/type: NPO DVT prophylaxis: not indicated GI prophylaxis: PPI Lines: N/A Foley:  N/A Code Status:  full code Last date of multidisciplinary goals of care discussion [Pending]  Labs   CBC: Recent Labs  Lab 03/14/23 2309 03-27-23 0155 03-27-23 1845 Mar 27, 2023 2026 03/27/2023 2336 03/16/23 0105 03/16/23 0218 03/16/23 0623 03/16/23 0621 03/16/23 0927  WBC 7.1  --  7.5 7.8  --  8.8  --  10.1  --   --   NEUTROABS 5.0  --   --  4.5  --   --   --   --   --   --   HGB 11.6*   < > 11.1* 11.1*   < > 10.5* 10.9* 9.7* 9.5* 9.9*  HCT 38.5   < > 36.8 36.6   < > 35.4* 32.0* 32.4* 28.0* 29.0*  MCV 91.2  --  92.2 92.0  --  92.7  --  91.5  --   --   PLT 286  --  271 276  --  234  --  202  --   --    < > = values in this interval not displayed.    Basic Metabolic Panel: Recent Labs  Lab  03/14/23 2309 27-Mar-2023 0155 27-Mar-2023 0402 27-Mar-2023 7628 March 27, 2023 1845 27-Mar-2023 2026 2023-03-27 2336 03/16/23 0105 03/16/23 0218 03/16/23 3151 03/16/23 0621 03/16/23 0927  NA 141   < > 144   < > 154*  --    < > 153* 159* 157* 161* 162*  K 3.3*   < > 3.8   < > 3.7  --    < > 3.4* 3.5 3.3* 3.1* 3.5  CL 107  --  110  --  121*  --   --  123*  --  127*  --   --   CO2 23  --  24  --  26  --   --  20*  --  20*  --   --   GLUCOSE 214*  --  173*  --  241*  --   --  201*  --  204*  --   --   BUN 15  --  17  --  13  --   --  16  --  15  --   --   CREATININE 0.98  --  1.12*  --  1.17*  --   --  1.26*  --  1.26*  --   --   CALCIUM 8.7*  --  8.8*  --  8.9  --   --  8.8*  --  9.0  --   --   MG  --   --   --   --   --  1.9  --   --   --   --   --   --   PHOS  --   --   --   --   --  2.0*  --   --   --   --   --   --    < > = values in this interval not displayed.   GFR: Estimated Creatinine Clearance: 49.3 mL/min (A) (by C-G formula based on SCr of 1.26 mg/dL (H)). Recent Labs  Lab 03/14/23 2309 03/15/23 0143 03/15/23 0402 03/15/23 1845 03/15/23 2026 03/16/23 0105 03/16/23 0614  WBC 7.1  --   --  7.5 7.8 8.8 10.1  LATICACIDVEN 2.2* 3.2* 1.8  --   --  2.0*  --     Liver Function Tests: Recent Labs  Lab 03/15/23 0402 03/15/23 1845 03/16/23 0105 03/16/23 0614  AST 15 15 14* 14*  ALT 11 10 9 11   ALKPHOS 85 87 83 69  BILITOT 0.2* 0.4 0.7 1.0  PROT 7.5 7.4 7.3 7.0  ALBUMIN 2.7* 2.4* 2.3* 2.5*   No results for input(s): "LIPASE", "AMYLASE" in the last 168 hours. No results for input(s): "AMMONIA" in the last 168 hours.  ABG    Component Value Date/Time   PHART 7.377 03/16/2023 0927   PCO2ART 34.8 03/16/2023 0927   PO2ART 155 (H) 03/16/2023 0927   HCO3 20.6 03/16/2023 0927   TCO2 22 03/16/2023 0927   ACIDBASEDEF 4.0 (H) 03/16/2023 0927   O2SAT 99 03/16/2023 0927     Coagulation Profile: Recent Labs  Lab 03/15/23 2030 03/16/23 0105 03/16/23 0614  INR 1.2 1.3* 1.3*     Cardiac Enzymes: No results for input(s): "CKTOTAL", "CKMB", "CKMBINDEX", "TROPONINI" in the last 168 hours.  HbA1C: Hgb A1c MFr Bld  Date/Time Value Ref Range Status  03/15/2023 04:02 AM 6.2 (H) 4.8 - 5.6 % Final    Comment:    (NOTE) Pre diabetes:          5.7%-6.4%  Diabetes:              >6.4%  Glycemic control for   <7.0% adults with diabetes   12/08/2022 05:22 PM 6.6 (H) 4.8 - 5.6 % Final    Comment:    (NOTE) Pre diabetes:          5.7%-6.4%  Diabetes:              >6.4%  Glycemic control for   <7.0% adults with diabetes     CBG: Recent Labs  Lab 03/15/23 1940 03/15/23 2230 03/15/23 2339 03/16/23 0341 03/16/23 1112  GLUCAP 159* 238* 193* 148* 211*    IMAGING: PORT CXR 03/14/23 (personally reviewed by me):   Endotracheal tube approximately 2 cm from the carina.  No bowel fullness and right hilum and right lower lobe adjacent to the cardiac border.  Question possible consolidation.  Film rotated slightly to the right but trachea midline.  Normal mediastinal contour.  No pleural effusion.  No pneumothorax.  Enteric feeding tube courses below the diaphragm.  CT HEAD W/O CONTRAST 03/14/23 (per radiologist):   IMPRESSION: 1. Massive intraparenchymal hematoma centered in the left frontal lobe measuring 9.8 x 4.7 x 4.5 cm (103 mL). This causes 20 mm of rightward midline shift, subfalcine herniation and left uncal herniation. 2. Blood within the fourth ventricle.  Review of Systems:   Unable to obtain a review of systems given patient's altered mental status.  Past Medical History:   Past Medical History:  Diagnosis Date   COPD (chronic obstructive pulmonary disease) (HCC)    DM2 (diabetes mellitus, type 2) (HCC)    HTN (hypertension)    MCI (mild cognitive impairment)    Morbid obesity with BMI of 60.0-69.9, adult (HCC)    Surgical History:  Unable to obtain a surgical history given patient's altered mental status.  Social History:   Social  History   Socioeconomic History   Marital status: Single    Spouse name: Not on file   Number of children: Not on file   Years of education: Not on file   Highest education level: Not on file  Occupational History   Not on file  Tobacco Use   Smoking status: Unknown   Smokeless tobacco: Never  Vaping Use   Vaping status: Never Used  Substance and Sexual Activity   Alcohol use: Not Currently   Drug use: Not Currently   Sexual activity: Not Currently  Other Topics Concern   Not on file  Social History Narrative   Not on file   Social Determinants of Health   Financial Resource Strain: Not on file  Food Insecurity: No Food Insecurity (09/20/2022)   Hunger Vital Sign    Worried About Running Out of Food in the Last Year: Never true    Ran Out of Food in the Last Year: Never true  Transportation Needs: No Transportation Needs (09/20/2022)   PRAPARE - Administrator, Civil Service (Medical): No    Lack of Transportation (Non-Medical): No  Physical Activity: Not on file  Stress: Not on file  Social Connections: Not on file    Family History:  Unable to obtain a family medical history given patient's altered mental status.  Allergies No Known Allergies   Home Medications  Prior to Admission medications   Medication Sig Start Date End Date Taking? Authorizing Provider  acetaminophen (TYLENOL) 500 MG tablet Take 1,000 mg by mouth 2 (two) times daily as needed for mild pain (pain score 1-3) or headache.   Yes [provider]  losartan (COZAAR) 25 MG tablet Take 25 mg by mouth daily. 02/10/23  Yes [provider]  nitrofurantoin, macrocrystal-monohydrate, (MACROBID) 100 MG capsule Take 100 mg by mouth 2 (two) times daily. 03/09/23  Yes [provider]  albuterol (VENTOLIN HFA) 108 (90 Base) MCG/ACT inhaler Inhale 2 puffs into the lungs every 6 (six) hours as needed for shortness of breath. 09/01/22   [provider]  aspirin 81 MG  chewable tablet Chew 81 mg by mouth daily.    [provider]  aspirin-acetaminophen-caffeine (EXCEDRIN MIGRAINE) [phone] MG tablet Take 1 tablet by mouth daily as needed for headache or migraine.    [provider]  atorvastatin (LIPITOR) 10 MG tablet Take 10 mg by mouth at bedtime. 08/10/22   [provider]  Benzocaine-Menthol (CEPACOL SORE THROAT) 10-2.1 MG LOZG Use as directed 1 lozenge in the mouth or throat daily.  [provider]  budesonide (PULMICORT) 0.5 MG/2ML nebulizer solution Take 0.5 mg by nebulization 2 times daily at 12 noon and 4 pm. 09/09/22   [provider]  calcium carbonate (TUMS - DOSED IN MG ELEMENTAL CALCIUM) 500 MG chewable tablet Chew 2 tablets by mouth daily.    [provider]  Cholecalciferol 50 MCG (2000 UT) TABS Take 2,000 Units by mouth daily.    [provider]  cloNIDine (CATAPRES) 0.1 MG tablet Take 0.1 mg by mouth every 12 (twelve) hours as needed (hypertension). If SBP > 160 or DBP >100. If BP >160/100 1 hour after administration call MD.    [provider]  cycloSPORINE (RESTASIS) 0.05 % ophthalmic emulsion Place 2 drops into both eyes 2 (two) times daily. 07/26/22   [provider]  diclofenac Sodium (VOLTAREN) 1 % GEL Apply 4 g topically daily. To bilateral hands 07/04/22   [provider]  DULoxetine (CYMBALTA) 60 MG capsule Take 60 mg by mouth daily. 08/10/22   [provider]  famotidine (PEPCID) 20 MG tablet Take 20 mg by mouth daily. 08/10/22   [provider]  feeding supplement (ENSURE ENLIVE / ENSURE PLUS) LIQD Take 237 mLs by mouth 3 (three) times daily between meals. Patient not taking: Reported on 12/08/2022 09/20/22   Champ Mungo, DO  ferrous sulfate 325 (65 FE) MG tablet Take 325 mg by mouth daily with breakfast.    [provider]  fluticasone (FLONASE) 50 MCG/ACT nasal spray Place 1 spray into both nostrils daily.    [provider]  folic acid (FOLVITE) 1 MG tablet Take 1 mg by mouth daily.    [provider]  gabapentin (NEURONTIN) 800 MG tablet Take 800 mg by mouth 2 (two) times daily. 08/10/22   [provider]  ipratropium (ATROVENT) 0.02 % nebulizer solution Take 0.5 mg by nebulization in the morning, at noon, and at bedtime. 09/03/22   [provider]  lidocaine (HM LIDOCAINE PATCH) 4 % Place 0.5 patches onto the skin daily.    [provider]  Menthol, Topical Analgesic, (BIOFREEZE PROFESSIONAL) 5 % GEL Apply 1 Application topically in the morning and at bedtime. To left arm & shoulder    [provider]  methotrexate (RHEUMATREX) 2.5 MG tablet Take 5 mg by mouth once a week. Caution:Chemotherapy. Protect from light. Tuesdays    [provider]  montelukast (SINGULAIR) 10 MG tablet Take 10 mg by mouth daily. 08/10/22   [provider]  oxyCODONE (OXY IR/ROXICODONE) 5 MG immediate release tablet Take 1 tablet (5 mg total) by mouth 2 (two) times daily. 12/15/22   Elgergawy, Leana Roe, MD  OZEMPIC, 0.25 OR 0.5 MG/DOSE, 2 MG/3ML SOPN Inject 0.5 mg into the skin once a week. Tuesdays 08/13/22   [provider]  polyethylene glycol (MIRALAX / GLYCOLAX) 17 g packet Take 17 g by mouth daily.    [provider]  sennosides-docusate sodium (SENOKOT-S) 8.6-50 MG tablet Take 2 tablets by mouth daily.    [provider]  tiZANidine (ZANAFLEX) 4 MG tablet Take 2 mg by mouth in the morning and at bedtime. 08/10/22   [provider]  topiramate (TOPAMAX) 25 MG tablet Take 25 mg by mouth 2 (two) times daily. 08/10/22   [provider]  Zinc Oxide (BAZA PROTECT MOISTURE BARRIER EX) Apply 1 Application topically in the morning and at bedtime.    [provider]     Critical care time:  CRITICAL CARE Performed by: Lesia Sago Jaheem Hedgepath   Total critical care time: 31 minutes  Critical care time was exclusive  of separately billable procedures and treating other patients.  Critical care was necessary to treat or prevent imminent or life-threatening deterioration.  Critical care was time spent personally by me on the following activities: development of treatment plan with patient and/or surrogate as well as nursing, discussions with consultants, evaluation of patient's response to treatment, examination of patient, obtaining history from patient or surrogate, ordering and performing treatments and interventions, ordering and review of laboratory studies, ordering and review of radiographic studies, pulse oximetry and re-evaluation of patient's condition.   Karren Burly, MD [facility] 12:06 PM 03/16/23   Please see Amion for pager details.  From 7A-7P if no response please call [phone] After hours, please call ELink [phone]

## 2023-03-16 NOTE — Progress Notes (Signed)
PHARMACY - PHYSICIAN COMMUNICATION CRITICAL VALUE ALERT - BLOOD CULTURE IDENTIFICATION (BCID)  Michelle Gay is an 72 y.o. female who presented to [facility] on 03/14/2023 with a chief complaint of left ICH s/p aggressive measures with hypertonic saline and has since been declared brain dead 04-12-2023.    Assessment:  1/4 bottles MRSA  Name of physician (or Provider) Contacted: Dr. Ardyth Harps  Current antibiotics: Vancomycin 1250 mg x1, Cefepime 2 gm IV Q12h  Changes to prescribed antibiotics recommended:  Patient is on recommended antibiotics - No changes needed  Results for orders placed or performed during the hospital encounter of 03/14/23  Blood Culture ID Panel (Reflexed) (Collected: Apr 12, 2023  7:53 PM)  Result Value Ref Range   Enterococcus faecalis NOT DETECTED NOT DETECTED   Enterococcus Faecium NOT DETECTED NOT DETECTED   Listeria monocytogenes NOT DETECTED NOT DETECTED   Staphylococcus species DETECTED (A) NOT DETECTED   Staphylococcus aureus (BCID) NOT DETECTED NOT DETECTED   Staphylococcus epidermidis DETECTED (A) NOT DETECTED   Staphylococcus lugdunensis NOT DETECTED NOT DETECTED   Streptococcus species NOT DETECTED NOT DETECTED   Streptococcus agalactiae NOT DETECTED NOT DETECTED   Streptococcus pneumoniae NOT DETECTED NOT DETECTED   Streptococcus pyogenes NOT DETECTED NOT DETECTED   A.calcoaceticus-baumannii NOT DETECTED NOT DETECTED   Bacteroides fragilis NOT DETECTED NOT DETECTED   Enterobacterales NOT DETECTED NOT DETECTED   Enterobacter cloacae complex NOT DETECTED NOT DETECTED   Escherichia coli NOT DETECTED NOT DETECTED   Klebsiella aerogenes NOT DETECTED NOT DETECTED   Klebsiella oxytoca NOT DETECTED NOT DETECTED   Klebsiella pneumoniae NOT DETECTED NOT DETECTED   Proteus species NOT DETECTED NOT DETECTED   Salmonella species NOT DETECTED NOT DETECTED   Serratia marcescens NOT DETECTED NOT DETECTED   Haemophilus influenzae NOT DETECTED NOT DETECTED    Neisseria meningitidis NOT DETECTED NOT DETECTED   Pseudomonas aeruginosa NOT DETECTED NOT DETECTED   Stenotrophomonas maltophilia NOT DETECTED NOT DETECTED   Candida albicans NOT DETECTED NOT DETECTED   Candida auris NOT DETECTED NOT DETECTED   Candida glabrata NOT DETECTED NOT DETECTED   Candida krusei NOT DETECTED NOT DETECTED   Candida parapsilosis NOT DETECTED NOT DETECTED   Candida tropicalis NOT DETECTED NOT DETECTED   Cryptococcus neoformans/gattii NOT DETECTED NOT DETECTED   Methicillin resistance mecA/C DETECTED (A) NOT DETECTED    Kirk Ruths Paytes 03/16/2023  6:06 PM

## 2023-03-16 NOTE — Plan of Care (Signed)
Problem: Education: Goal: Knowledge of disease or condition will improve Outcome: Not Progressing Goal: Knowledge of secondary prevention will improve (MUST DOCUMENT ALL) Outcome: Not Progressing Goal: Knowledge of patient specific risk factors will improve Loraine Leriche N/A or DELETE if not current risk factor) Outcome: Not Progressing   Problem: Intracerebral Hemorrhage Tissue Perfusion: Goal: Complications of Intracerebral Hemorrhage will be minimized Outcome: Not Progressing   Problem: Coping: Goal: Will verbalize positive feelings about self Outcome: Not Progressing Goal: Will identify appropriate support needs Outcome: Not Progressing   Problem: Health Behavior/Discharge Planning: Goal: Ability to manage health-related needs will improve Outcome: Not Progressing Goal: Goals will be collaboratively established with patient/family Outcome: Not Progressing   Problem: Self-Care: Goal: Ability to participate in self-care as condition permits will improve Outcome: Not Progressing Goal: Verbalization of feelings and concerns over difficulty with self-care will improve Outcome: Not Progressing Goal: Ability to communicate needs accurately will improve Outcome: Not Progressing   Problem: Nutrition: Goal: Risk of aspiration will decrease Outcome: Not Progressing Goal: Dietary intake will improve Outcome: Not Progressing   Problem: Activity: Goal: Ability to tolerate increased activity will improve Outcome: Not Progressing   Problem: Respiratory: Goal: Ability to maintain a clear airway and adequate ventilation will improve Outcome: Not Progressing   Problem: Role Relationship: Goal: Method of communication will improve Outcome: Not Progressing   Problem: Education: Goal: Knowledge of General Education information will improve Description: Including pain rating scale, medication(s)/side effects and non-pharmacologic comfort measures Outcome: Not Progressing   Problem:  Health Behavior/Discharge Planning: Goal: Ability to manage health-related needs will improve Outcome: Not Progressing   Problem: Clinical Measurements: Goal: Ability to maintain clinical measurements within normal limits will improve Outcome: Not Progressing Goal: Will remain free from infection Outcome: Not Progressing Goal: Diagnostic test results will improve Outcome: Not Progressing Goal: Respiratory complications will improve Outcome: Not Progressing Goal: Cardiovascular complication will be avoided Outcome: Not Progressing   Problem: Activity: Goal: Risk for activity intolerance will decrease Outcome: Not Progressing   Problem: Nutrition: Goal: Adequate nutrition will be maintained Outcome: Not Progressing   Problem: Coping: Goal: Level of anxiety will decrease Outcome: Not Progressing   Problem: Elimination: Goal: Will not experience complications related to bowel motility Outcome: Not Progressing Goal: Will not experience complications related to urinary retention Outcome: Not Progressing   Problem: Pain Management: Goal: General experience of comfort will improve Outcome: Not Progressing   Problem: Safety: Goal: Ability to remain free from injury will improve Outcome: Not Progressing   Problem: Skin Integrity: Goal: Risk for impaired skin integrity will decrease Outcome: Not Progressing   Problem: Education: Goal: Ability to describe self-care measures that may prevent or decrease complications (Diabetes Survival Skills Education) will improve Outcome: Not Progressing Goal: Individualized Educational Video(s) Outcome: Not Progressing   Problem: Coping: Goal: Ability to adjust to condition or change in health will improve Outcome: Not Progressing   Problem: Fluid Volume: Goal: Ability to maintain a balanced intake and output will improve Outcome: Not Progressing   Problem: Health Behavior/Discharge Planning: Goal: Ability to identify and utilize  available resources and services will improve Outcome: Not Progressing Goal: Ability to manage health-related needs will improve Outcome: Not Progressing   Problem: Metabolic: Goal: Ability to maintain appropriate glucose levels will improve Outcome: Not Progressing   Problem: Nutritional: Goal: Maintenance of adequate nutrition will improve Outcome: Not Progressing Goal: Progress toward achieving an optimal weight will improve Outcome: Not Progressing   Problem: Skin Integrity: Goal: Risk for impaired skin integrity will decrease  Outcome: Not Progressing   Problem: Tissue Perfusion: Goal: Adequacy of tissue perfusion will improve Outcome: Not Progressing

## 2023-03-16 NOTE — Progress Notes (Signed)
SLP Cancellation Note  Patient Details Name: Michelle Gay MRN: [id number] DOB: [birth year]   Cancelled treatment:       Reason Eval/Treat Not Completed: Patient not medically ready. Signing off   [name], [name] 03/16/2023, 11:49 AM

## 2023-03-16 NOTE — Progress Notes (Signed)
Pre-op Diagnosis: Asymmetric, hyperpigmented skin lesion Post-op Diagnosis: Same Procedure: Punch Skin Biopsy Location: Left pectoral/axilla region Performing Physician: Elberta Fortis, MD  Patient brain-dead, procedure performed at the request of [facility] as workup for organ donation. Time out performed. The area surrounding the skin lesion was prepared and cleaned with ChloraPrep. A size 6mm disposable biopsy punch tool was used to obtain tissue sample and placed in labeled biopsy cup. Hemostasis was achieved with pressure. The site was closed with two interrupted sutures. The patient tolerated the procedure well without complications.  Results to be forwarded to [facility].

## 2023-03-16 NOTE — Progress Notes (Addendum)
PT Cancellation Note  Patient Details Name: Michelle Gay MRN: [id number] DOB: [birth year]   Cancelled Treatment:    Reason Eval/Treat Not Completed: Active bedrest order along with notation of brain death. Will sign off   Michelle Gay 03/16/2023, 7:06 AM Michelle Gay, PT Acute Rehabilitation Services Office: [phone]

## 2023-03-16 NOTE — Progress Notes (Signed)
OT Cancellation Note  Patient Details Name: Michelle Gay MRN: [id number] DOB: 02/14/1951   Cancelled Treatment:    Reason Eval/Treat Not Completed: Active bedrest order (Pt not appropriate for OT, signing off.)  Evern Bio 03/16/2023, 9:04 AM Berna Spare, OTR/L Acute Rehabilitation Services Office: [phone]

## 2023-03-16 NOTE — Plan of Care (Signed)
Problem: Education: Goal: Knowledge of disease or condition will improve 03/16/2023 1804 by Reatha Armour, RN Outcome: Not Progressing 03/16/2023 1637 by Reatha Armour, RN Outcome: Not Progressing Goal: Knowledge of secondary prevention will improve (MUST DOCUMENT ALL) 03/16/2023 1804 by Reatha Armour, RN Outcome: Not Progressing 03/16/2023 1637 by Reatha Armour, RN Outcome: Not Progressing Goal: Knowledge of patient specific risk factors will improve Loraine Leriche N/A or DELETE if not current risk factor) 03/16/2023 1804 by Reatha Armour, RN Outcome: Not Progressing 03/16/2023 1637 by Reatha Armour, RN Outcome: Not Progressing   Problem: Intracerebral Hemorrhage Tissue Perfusion: Goal: Complications of Intracerebral Hemorrhage will be minimized 03/16/2023 1804 by Reatha Armour, RN Outcome: Not Progressing 03/16/2023 1637 by Reatha Armour, RN Outcome: Not Progressing   Problem: Coping: Goal: Will verbalize positive feelings about self 03/16/2023 1804 by Reatha Armour, RN Outcome: Not Progressing 03/16/2023 1637 by Reatha Armour, RN Outcome: Not Progressing Goal: Will identify appropriate support needs 03/16/2023 1804 by Reatha Armour, RN Outcome: Not Progressing 03/16/2023 1637 by Reatha Armour, RN Outcome: Not Progressing   Problem: Health Behavior/Discharge Planning: Goal: Ability to manage health-related needs will improve 03/16/2023 1804 by Reatha Armour, RN Outcome: Not Progressing 03/16/2023 1637 by Reatha Armour, RN Outcome: Not Progressing Goal: Goals will be collaboratively established with patient/family 03/16/2023 1804 by Reatha Armour, RN Outcome: Not Progressing 03/16/2023 1637 by Reatha Armour, RN Outcome: Not Progressing   Problem: Self-Care: Goal: Ability to participate in self-care as condition  permits will improve 03/16/2023 1804 by Reatha Armour, RN Outcome: Not Progressing 03/16/2023 1637 by Reatha Armour, RN Outcome: Not Progressing Goal: Verbalization of feelings and concerns over difficulty with self-care will improve 03/16/2023 1804 by Reatha Armour, RN Outcome: Not Progressing 03/16/2023 1637 by Reatha Armour, RN Outcome: Not Progressing Goal: Ability to communicate needs accurately will improve 03/16/2023 1804 by Reatha Armour, RN Outcome: Not Progressing 03/16/2023 1637 by Reatha Armour, RN Outcome: Not Progressing   Problem: Nutrition: Goal: Risk of aspiration will decrease 03/16/2023 1804 by Reatha Armour, RN Outcome: Not Progressing 03/16/2023 1637 by Reatha Armour, RN Outcome: Not Progressing Goal: Dietary intake will improve 03/16/2023 1804 by Reatha Armour, RN Outcome: Not Progressing 03/16/2023 1637 by Reatha Armour, RN Outcome: Not Progressing   Problem: Activity: Goal: Ability to tolerate increased activity will improve 03/16/2023 1804 by Reatha Armour, RN Outcome: Not Progressing 03/16/2023 1637 by Reatha Armour, RN Outcome: Not Progressing   Problem: Respiratory: Goal: Ability to maintain a clear airway and adequate ventilation will improve 03/16/2023 1804 by Reatha Armour, RN Outcome: Not Progressing 03/16/2023 1637 by Reatha Armour, RN Outcome: Not Progressing   Problem: Role Relationship: Goal: Method of communication will improve 03/16/2023 1804 by Reatha Armour, RN Outcome: Not Progressing 03/16/2023 1637 by Reatha Armour, RN Outcome: Not Progressing   Problem: Education: Goal: Knowledge of General Education information will improve Description: Including pain rating scale, medication(s)/side effects and non-pharmacologic comfort measures 03/16/2023 1804 by Reatha Armour, RN Outcome: Not Progressing 03/16/2023 1637 by Reatha Armour, RN Outcome: Not Progressing   Problem: Health Behavior/Discharge Planning: Goal: Ability to manage health-related needs will improve 03/16/2023 1804 by Reatha Armour, RN Outcome: Not Progressing 03/16/2023 1637 by Reatha Armour, RN Outcome: Not Progressing   Problem: Clinical Measurements: Goal: Ability to maintain clinical measurements within normal limits will improve 03/16/2023 1804 by Tomi Likens,  Leighton Parody, RN Outcome: Not Progressing 03/16/2023 1637 by Reatha Armour, RN Outcome: Not Progressing Goal: Will remain free from infection 03/16/2023 1804 by Reatha Armour, RN Outcome: Not Progressing 03/16/2023 1637 by Reatha Armour, RN Outcome: Not Progressing Goal: Diagnostic test results will improve 03/16/2023 1804 by Reatha Armour, RN Outcome: Not Progressing 03/16/2023 1637 by Reatha Armour, RN Outcome: Not Progressing Goal: Respiratory complications will improve 03/16/2023 1804 by Reatha Armour, RN Outcome: Not Progressing 03/16/2023 1637 by Reatha Armour, RN Outcome: Not Progressing Goal: Cardiovascular complication will be avoided 03/16/2023 1804 by Reatha Armour, RN Outcome: Not Progressing 03/16/2023 1637 by Reatha Armour, RN Outcome: Not Progressing   Problem: Activity: Goal: Risk for activity intolerance will decrease 03/16/2023 1804 by Reatha Armour, RN Outcome: Not Progressing 03/16/2023 1637 by Reatha Armour, RN Outcome: Not Progressing   Problem: Nutrition: Goal: Adequate nutrition will be maintained 03/16/2023 1804 by Reatha Armour, RN Outcome: Not Progressing 03/16/2023 1637 by Reatha Armour, RN Outcome: Not Progressing   Problem: Coping: Goal: Level of anxiety will decrease 03/16/2023 1804 by Reatha Armour, RN Outcome: Not Progressing 03/16/2023 1637 by Reatha Armour, RN Outcome: Not Progressing   Problem: Elimination: Goal: Will not experience complications related to bowel motility 03/16/2023 1804 by Reatha Armour, RN Outcome: Not Progressing 03/16/2023 1637 by Reatha Armour, RN Outcome: Not Progressing Goal: Will not experience complications related to urinary retention 03/16/2023 1804 by Reatha Armour, RN Outcome: Not Progressing 03/16/2023 1637 by Reatha Armour, RN Outcome: Not Progressing   Problem: Pain Management: Goal: General experience of comfort will improve 03/16/2023 1804 by Reatha Armour, RN Outcome: Not Progressing 03/16/2023 1637 by Reatha Armour, RN Outcome: Not Progressing   Problem: Safety: Goal: Ability to remain free from injury will improve 03/16/2023 1804 by Reatha Armour, RN Outcome: Not Progressing 03/16/2023 1637 by Reatha Armour, RN Outcome: Not Progressing   Problem: Skin Integrity: Goal: Risk for impaired skin integrity will decrease 03/16/2023 1804 by Reatha Armour, RN Outcome: Not Progressing 03/16/2023 1637 by Reatha Armour, RN Outcome: Not Progressing   Problem: Education: Goal: Ability to describe self-care measures that may prevent or decrease complications (Diabetes Survival Skills Education) will improve 03/16/2023 1804 by Reatha Armour, RN Outcome: Not Progressing 03/16/2023 1637 by Reatha Armour, RN Outcome: Not Progressing Goal: Individualized Educational Video(s) 03/16/2023 1804 by Reatha Armour, RN Outcome: Not Progressing 03/16/2023 1637 by Reatha Armour, RN Outcome: Not Progressing   Problem: Coping: Goal: Ability to adjust to condition or change in health will improve 03/16/2023 1804 by Reatha Armour, RN Outcome: Not Progressing 03/16/2023 1637 by Reatha Armour, RN Outcome: Not Progressing   Problem: Fluid Volume: Goal: Ability to maintain a balanced intake and output will improve 03/16/2023 1804 by Reatha Armour, RN Outcome: Not Progressing 03/16/2023 1637 by Reatha Armour, RN Outcome: Not Progressing   Problem: Health Behavior/Discharge Planning: Goal: Ability to identify and utilize available resources and services will improve 03/16/2023 1804 by Reatha Armour, RN Outcome: Not Progressing 03/16/2023 1637 by Reatha Armour, RN Outcome: Not Progressing Goal: Ability to manage health-related needs will improve 03/16/2023 1804 by Reatha Armour, RN Outcome: Not Progressing 03/16/2023 1637 by Reatha Armour, RN Outcome: Not Progressing   Problem: Metabolic: Goal: Ability to maintain appropriate glucose levels will improve 03/16/2023 1804 by Reatha Armour, RN Outcome: Not Progressing 03/16/2023  1637 by Reatha Armour, RN Outcome: Not Progressing   Problem: Nutritional: Goal: Maintenance of adequate nutrition will improve 03/16/2023 1804 by Reatha Armour, RN Outcome: Not Progressing 03/16/2023 1637 by Reatha Armour, RN Outcome: Not Progressing Goal: Progress toward achieving an optimal weight will improve 03/16/2023 1804 by Reatha Armour, RN Outcome: Not Progressing 03/16/2023 1637 by Reatha Armour, RN Outcome: Not Progressing   Problem: Skin Integrity: Goal: Risk for impaired skin integrity will decrease 03/16/2023 1804 by Reatha Armour, RN Outcome: Not Progressing 03/16/2023 1637 by Reatha Armour, RN Outcome: Not Progressing   Problem: Tissue Perfusion: Goal: Adequacy of tissue perfusion will improve 03/16/2023 1804 by Reatha Armour, RN Outcome: Not Progressing 03/16/2023 1637 by Reatha Armour, RN Outcome: Not Progressing

## 2023-03-16 NOTE — TOC CM/SW Note (Signed)
Transition of Care [facility]) - Inpatient Brief Assessment   Patient Details  Name: Michelle Gay MRN: [id number] Date of Birth: [birth year]  [facility] CM/SW Contact:    Tom-Johnson, Hershal Coria, RN Phone Number: 03/16/2023, 1:52 PM   Clinical Narrative:  Patient presented to the ED after being found unresponsive at SNF, patient is from [facility]. 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 Currently intubated and on Comfort Care.   Organ Procurement done.   No TOC needs or recommendations noted at this time.  TOC will continue to follow and render compassionate support at this time.           Transition of Care Asessment: Insurance and Status: Insurance coverage has been reviewed Patient has primary care physician: Yes Home environment has been reviewed: SNF Prior level of function:: SNF Prior/Current Home Services: Current home services Social Determinants of Health Reivew: SDOH reviewed no interventions necessary Readmission risk has been reviewed: Yes Transition of care needs: transition of care needs identified, TOC will continue to follow

## 2023-03-17 DIAGNOSIS — J9600 Acute respiratory failure, unspecified whether with hypoxia or hypercapnia: Secondary | ICD-10-CM

## 2023-03-17 DIAGNOSIS — I611 Nontraumatic intracerebral hemorrhage in hemisphere, cortical: Secondary | ICD-10-CM | POA: Diagnosis not present

## 2023-03-17 LAB — COMPREHENSIVE METABOLIC PANEL
ALT: 13 U/L (ref 0–44)
ALT: 12 U/L (ref 0–44)
ALT: 12 U/L (ref 0–44)
AST: 19 U/L (ref 15–41)
AST: 15 U/L (ref 15–41)
AST: 13 U/L — ABNORMAL LOW (ref 15–41)
Albumin: 2.3 g/dL — ABNORMAL LOW (ref 3.5–5.0)
Albumin: 2.1 g/dL — ABNORMAL LOW (ref 3.5–5.0)
Albumin: 2.1 g/dL — ABNORMAL LOW (ref 3.5–5.0)
Alkaline Phosphatase: 70 U/L (ref 38–126)
Alkaline Phosphatase: 72 U/L (ref 38–126)
Alkaline Phosphatase: 72 U/L (ref 38–126)
Anion gap: 6 (ref 5–15)
Anion gap: 6 (ref 5–15)
Anion gap: 8 (ref 5–15)
BUN: 18 mg/dL (ref 8–23)
BUN: 17 mg/dL (ref 8–23)
BUN: 22 mg/dL (ref 8–23)
CO2: 21 mmol/L — ABNORMAL LOW (ref 22–32)
CO2: 20 mmol/L — ABNORMAL LOW (ref 22–32)
CO2: 20 mmol/L — ABNORMAL LOW (ref 22–32)
Calcium: 8.9 mg/dL (ref 8.9–10.3)
Calcium: 8.5 mg/dL — ABNORMAL LOW (ref 8.9–10.3)
Calcium: 8.7 mg/dL — ABNORMAL LOW (ref 8.9–10.3)
Chloride: 130 mmol/L — ABNORMAL HIGH (ref 98–111)
Chloride: 129 mmol/L — ABNORMAL HIGH (ref 98–111)
Chloride: 124 mmol/L — ABNORMAL HIGH (ref 98–111)
Creatinine, Ser: 1.44 mg/dL — ABNORMAL HIGH (ref 0.44–1.00)
Creatinine, Ser: 1.16 mg/dL — ABNORMAL HIGH (ref 0.44–1.00)
Creatinine, Ser: 1.14 mg/dL — ABNORMAL HIGH (ref 0.44–1.00)
GFR, Estimated: 39 mL/min — ABNORMAL LOW (ref >60)
GFR, Estimated: 50 mL/min — ABNORMAL LOW (ref >60)
GFR, Estimated: 51 mL/min — ABNORMAL LOW (ref >60)
Glucose, Bld: 276 mg/dL — ABNORMAL HIGH (ref 70–99)
Glucose, Bld: 119 mg/dL — ABNORMAL HIGH (ref 70–99)
Glucose, Bld: 98 mg/dL (ref 70–99)
Potassium: 3.1 mmol/L — ABNORMAL LOW (ref 3.5–5.1)
Potassium: 3.6 mmol/L (ref 3.5–5.1)
Potassium: 3.7 mmol/L (ref 3.5–5.1)
Sodium: 157 mmol/L — ABNORMAL HIGH (ref 135–145)
Sodium: 155 mmol/L — ABNORMAL HIGH (ref 135–145)
Sodium: 152 mmol/L — ABNORMAL HIGH (ref 135–145)
Total Bilirubin: 0.3 mg/dL (ref 0.3–1.2)
Total Bilirubin: 0.3 mg/dL (ref 0.3–1.2)
Total Bilirubin: 0.5 mg/dL (ref 0.3–1.2)
Total Protein: 7 g/dL (ref 6.5–8.1)
Total Protein: 6.5 g/dL (ref 6.5–8.1)
Total Protein: 6.5 g/dL (ref 6.5–8.1)

## 2023-03-17 LAB — CBC WITH DIFFERENTIAL/PLATELET
Abs Immature Granulocytes: 0.55 10*3/uL — ABNORMAL HIGH (ref 0.00–0.07)
Abs Immature Granulocytes: 0.23 10*3/uL — ABNORMAL HIGH (ref 0.00–0.07)
Basophils Absolute: 0 10*3/uL (ref 0.0–0.1)
Basophils Absolute: 0 10*3/uL (ref 0.0–0.1)
Basophils Relative: 0 %
Basophils Relative: 0 %
Eosinophils Absolute: 0 10*3/uL (ref 0.0–0.5)
Eosinophils Absolute: 0 10*3/uL (ref 0.0–0.5)
Eosinophils Relative: 0 %
Eosinophils Relative: 0 %
HCT: 26.7 % — ABNORMAL LOW (ref 36.0–46.0)
HCT: 26.9 % — ABNORMAL LOW (ref 36.0–46.0)
Hemoglobin: 8 g/dL — ABNORMAL LOW (ref 12.0–15.0)
Hemoglobin: 8 g/dL — ABNORMAL LOW (ref 12.0–15.0)
Immature Granulocytes: 3 %
Immature Granulocytes: 1 %
Lymphocytes Relative: 9 %
Lymphocytes Relative: 10 %
Lymphs Abs: 1.6 10*3/uL (ref 0.7–4.0)
Lymphs Abs: 1.6 10*3/uL (ref 0.7–4.0)
MCH: 27.5 pg (ref 26.0–34.0)
MCH: 27.6 pg (ref 26.0–34.0)
MCHC: 30 g/dL (ref 30.0–36.0)
MCHC: 29.7 g/dL — ABNORMAL LOW (ref 30.0–36.0)
MCV: 91.8 fL (ref 80.0–100.0)
MCV: 92.8 fL (ref 80.0–100.0)
Monocytes Absolute: 0.7 10*3/uL (ref 0.1–1.0)
Monocytes Absolute: 0.9 10*3/uL (ref 0.1–1.0)
Monocytes Relative: 4 %
Monocytes Relative: 5 %
Neutro Abs: 14.8 10*3/uL — ABNORMAL HIGH (ref 1.7–7.7)
Neutro Abs: 14.2 10*3/uL — ABNORMAL HIGH (ref 1.7–7.7)
Neutrophils Relative %: 84 %
Neutrophils Relative %: 84 %
Platelets: 170 10*3/uL (ref 150–400)
Platelets: 172 10*3/uL (ref 150–400)
RBC: 2.91 MIL/uL — ABNORMAL LOW (ref 3.87–5.11)
RBC: 2.9 MIL/uL — ABNORMAL LOW (ref 3.87–5.11)
RDW: 16.8 % — ABNORMAL HIGH (ref 11.5–15.5)
RDW: 17.1 % — ABNORMAL HIGH (ref 11.5–15.5)
WBC: 17.8 10*3/uL — ABNORMAL HIGH (ref 4.0–10.5)
WBC: 17 10*3/uL — ABNORMAL HIGH (ref 4.0–10.5)
nRBC: 0 % (ref 0.0–0.2)
nRBC: 0 % (ref 0.0–0.2)

## 2023-03-17 LAB — URINALYSIS, ROUTINE W REFLEX MICROSCOPIC
Bilirubin Urine: NEGATIVE
Glucose, UA: NEGATIVE mg/dL
Ketones, ur: NEGATIVE mg/dL
Nitrite: NEGATIVE
Protein, ur: NEGATIVE mg/dL
RBC / HPF: >50 RBC/hpf (ref 0–5)
Specific Gravity, Urine: 1.008 (ref 1.005–1.030)
WBC, UA: >50 WBC/hpf (ref 0–5)
pH: 6 (ref 5.0–8.0)

## 2023-03-17 LAB — GLUCOSE, CAPILLARY
Glucose-Capillary: 121 mg/dL — ABNORMAL HIGH (ref 70–99)
Glucose-Capillary: 132 mg/dL — ABNORMAL HIGH (ref 70–99)
Glucose-Capillary: 146 mg/dL — ABNORMAL HIGH (ref 70–99)
Glucose-Capillary: 164 mg/dL — ABNORMAL HIGH (ref 70–99)
Glucose-Capillary: 168 mg/dL — ABNORMAL HIGH (ref 70–99)
Glucose-Capillary: 179 mg/dL — ABNORMAL HIGH (ref 70–99)
Glucose-Capillary: 179 mg/dL — ABNORMAL HIGH (ref 70–99)
Glucose-Capillary: 185 mg/dL — ABNORMAL HIGH (ref 70–99)
Glucose-Capillary: 204 mg/dL — ABNORMAL HIGH (ref 70–99)
Glucose-Capillary: 205 mg/dL — ABNORMAL HIGH (ref 70–99)
Glucose-Capillary: 227 mg/dL — ABNORMAL HIGH (ref 70–99)
Glucose-Capillary: 238 mg/dL — ABNORMAL HIGH (ref 70–99)
Glucose-Capillary: 248 mg/dL — ABNORMAL HIGH (ref 70–99)
Glucose-Capillary: 46 mg/dL — ABNORMAL LOW (ref 70–99)
Glucose-Capillary: 46 mg/dL — ABNORMAL LOW (ref 70–99)
Glucose-Capillary: 48 mg/dL — ABNORMAL LOW (ref 70–99)
Glucose-Capillary: 68 mg/dL — ABNORMAL LOW (ref 70–99)
Glucose-Capillary: 70 mg/dL (ref 70–99)
Glucose-Capillary: 74 mg/dL (ref 70–99)
Glucose-Capillary: 84 mg/dL (ref 70–99)
Glucose-Capillary: 96 mg/dL (ref 70–99)

## 2023-03-17 LAB — PROTIME-INR
INR: 1.5 — ABNORMAL HIGH (ref 0.8–1.2)
INR: 1.5 — ABNORMAL HIGH (ref 0.8–1.2)
INR: 1.5 — ABNORMAL HIGH (ref 0.8–1.2)
INR: 1.5 — ABNORMAL HIGH (ref 0.8–1.2)
Prothrombin Time: 18.4 s — ABNORMAL HIGH (ref 11.4–15.2)
Prothrombin Time: 18.7 s — ABNORMAL HIGH (ref 11.4–15.2)
Prothrombin Time: 18.2 s — ABNORMAL HIGH (ref 11.4–15.2)
Prothrombin Time: 18.6 s — ABNORMAL HIGH (ref 11.4–15.2)

## 2023-03-17 LAB — POCT I-STAT 7, (LYTES, BLD GAS, ICA,H+H)
Acid-base deficit: 5 mmol/L — ABNORMAL HIGH (ref 0.0–2.0)
Acid-base deficit: 6 mmol/L — ABNORMAL HIGH (ref 0.0–2.0)
Bicarbonate: 19.9 mmol/L — ABNORMAL LOW (ref 20.0–28.0)
Bicarbonate: 19.5 mmol/L — ABNORMAL LOW (ref 20.0–28.0)
Calcium, Ion: 1.33 mmol/L (ref 1.15–1.40)
Calcium, Ion: 1.31 mmol/L (ref 1.15–1.40)
HCT: 26 % — ABNORMAL LOW (ref 36.0–46.0)
HCT: 25 % — ABNORMAL LOW (ref 36.0–46.0)
Hemoglobin: 8.8 g/dL — ABNORMAL LOW (ref 12.0–15.0)
Hemoglobin: 8.5 g/dL — ABNORMAL LOW (ref 12.0–15.0)
O2 Saturation: 99 %
O2 Saturation: 99 %
Patient temperature: 35.8
Patient temperature: 36.6
Potassium: 3.5 mmol/L (ref 3.5–5.1)
Potassium: 3.6 mmol/L (ref 3.5–5.1)
Sodium: 160 mmol/L — ABNORMAL HIGH (ref 135–145)
Sodium: 158 mmol/L — ABNORMAL HIGH (ref 135–145)
TCO2: 21 mmol/L — ABNORMAL LOW (ref 22–32)
TCO2: 21 mmol/L — ABNORMAL LOW (ref 22–32)
pCO2 arterial: 33.2 mm[Hg] (ref 32–48)
pCO2 arterial: 35.8 mm[Hg] (ref 32–48)
pH, Arterial: 7.382 (ref 7.35–7.45)
pH, Arterial: 7.344 — ABNORMAL LOW (ref 7.35–7.45)
pO2, Arterial: 159 mm[Hg] — ABNORMAL HIGH (ref 83–108)
pO2, Arterial: 151 mm[Hg] — ABNORMAL HIGH (ref 83–108)

## 2023-03-17 LAB — PHOSPHORUS
Phosphorus: <1 mg/dL — CL (ref 2.5–4.6)
Phosphorus: 1.9 mg/dL — ABNORMAL LOW (ref 2.5–4.6)
Phosphorus: 2.7 mg/dL (ref 2.5–4.6)
Phosphorus: 2.5 mg/dL (ref 2.5–4.6)
Phosphorus: 1.9 mg/dL — ABNORMAL LOW (ref 2.5–4.6)

## 2023-03-17 LAB — BLOOD GAS, ARTERIAL
Acid-base deficit: 3.9 mmol/L — ABNORMAL HIGH (ref 0.0–2.0)
Bicarbonate: 20.6 mmol/L (ref 20.0–28.0)
O2 Saturation: 100 %
Patient temperature: 36.4
pCO2 arterial: 33 mm[Hg] (ref 32–48)
pH, Arterial: 7.4 (ref 7.35–7.45)
pO2, Arterial: 159 mm[Hg] — ABNORMAL HIGH (ref 83–108)

## 2023-03-17 LAB — APTT
aPTT: 34 s (ref 24–36)
aPTT: 32 s (ref 24–36)
aPTT: 31 s (ref 24–36)
aPTT: 32 s (ref 24–36)
aPTT: 36 s (ref 24–36)

## 2023-03-17 LAB — BILIRUBIN, DIRECT
Bilirubin, Direct: <0.1 mg/dL (ref 0.0–0.2)
Bilirubin, Direct: 0.1 mg/dL (ref 0.0–0.2)
Bilirubin, Direct: 0.1 mg/dL (ref 0.0–0.2)
Bilirubin, Direct: <0.1 mg/dL (ref 0.0–0.2)

## 2023-03-17 LAB — MAGNESIUM
Magnesium: 1.9 mg/dL (ref 1.7–2.4)
Magnesium: 2.3 mg/dL (ref 1.7–2.4)
Magnesium: 2.2 mg/dL (ref 1.7–2.4)
Magnesium: 2.2 mg/dL (ref 1.7–2.4)
Magnesium: 2.1 mg/dL (ref 1.7–2.4)

## 2023-03-17 LAB — URINE CULTURE: Culture: NO GROWTH

## 2023-03-17 LAB — CBC
HCT: 29.8 % — ABNORMAL LOW (ref 36.0–46.0)
Hemoglobin: 8.9 g/dL — ABNORMAL LOW (ref 12.0–15.0)
MCH: 27.9 pg (ref 26.0–34.0)
MCHC: 29.9 g/dL — ABNORMAL LOW (ref 30.0–36.0)
MCV: 93.4 fL (ref 80.0–100.0)
Platelets: 200 10*3/uL (ref 150–400)
RBC: 3.19 MIL/uL — ABNORMAL LOW (ref 3.87–5.11)
RDW: 16.6 % — ABNORMAL HIGH (ref 11.5–15.5)
WBC: 19 10*3/uL — ABNORMAL HIGH (ref 4.0–10.5)
nRBC: 0 % (ref 0.0–0.2)

## 2023-03-17 MED ORDER — POTASSIUM PHOSPHATES 15 MMOLE/5ML IV SOLN
30.0000 mmol | Freq: Once | INTRAVENOUS | Status: AC
  Administered 2023-03-17: 30 mmol via INTRAVENOUS
  Filled 2023-03-17: qty 10

## 2023-03-17 MED ORDER — DEXTROSE 50 % IV SOLN
INTRAVENOUS | Status: AC
  Administered 2023-03-17: 50 mL via INTRAVENOUS
  Filled 2023-03-17: qty 50

## 2023-03-17 MED ORDER — VANCOMYCIN HCL 1500 MG/300ML IV SOLN
1500.0000 mg | Freq: Once | INTRAVENOUS | Status: AC
  Administered 2023-03-17: 1500 mg via INTRAVENOUS
  Filled 2023-03-17: qty 300

## 2023-03-17 MED ORDER — DEXTROSE IN LACTATED RINGERS 5 % IV SOLN: INTRAVENOUS | Status: DC

## 2023-03-17 MED ORDER — POTASSIUM CHLORIDE 10 MEQ/50ML IV SOLN
10.0000 meq | INTRAVENOUS | Status: DC
  Administered 2023-03-17 (×2): 10 meq via INTRAVENOUS
  Filled 2023-03-17 (×4): qty 50

## 2023-03-17 MED ORDER — VANCOMYCIN VARIABLE DOSE PER UNSTABLE RENAL FUNCTION (PHARMACIST DOSING): Status: DC

## 2023-03-17 MED ORDER — MAGNESIUM SULFATE 2 GM/50ML IV SOLN
2.0000 g | Freq: Once | INTRAVENOUS | Status: AC
  Administered 2023-03-17: 2 g via INTRAVENOUS
  Filled 2023-03-17: qty 50

## 2023-03-17 NOTE — Progress Notes (Addendum)
NAME:  Michelle Gay, MRN:  [id number], DOB:  [birth year], LOS: 2 ADMISSION DATE:  03/14/2023, CONSULTATION DATE:  03/17/23 REFERRING MD:  Roxy Horseman PA, CHIEF COMPLAINT:  ICH   History of Present Illness:  History obtained from the ED provider and electronic medical record as the patient arrived with altered mentation and is currently intubated.  Has a known history of COPD and hypertension.  Patient was transported to the emergency department around 10:42 PM this evening from her skilled facility by EMS.  Patient was found at 915 after dinner unresponsive.  Notably does take pain medication and Narcan was administered without improvement.  Patient was documented as normal around 7:30 PM.  Upon EMS arrival patient was unresponsive.  Patient arrived to the emergency department on nonrebreather mask with sonorous respirations.  With altered mental status patient was also noted to have no gag reflex.  Patient was subsequently endotracheally intubated by ED provider.  In the emergency department the patient's blood pressure has fluctuated.  She has also been somewhat hypothermic.  CT imaging of the brain revealed a "massive intraparenchymal hematoma" centered in the left frontal lobe resulting in rightward midline shift with subfalcine and left uncal herniation as well as extensive edema and hemorrhage within the fourth ventricle.  Patient was evaluated by neurology.  Staff as well as myself have attempted to contact multiple family members.  I personally called Meriel Flavors [phone]), Wonda Amis [phone]), and Barbara Cower [phone]).  Unfortunately I was only able to leave a message with the first and last telephone numbers.  Ultimately Ms. Maisie Fus did call back and spoke with someone in the emergency department.  Family are en route to our facility but arrival time is unclear.  Family did not wish to discuss CODE STATUS or goals of care until they arrived per ED provider report. PCCM  asked to consult and assist neurology with the patient management as she is now intubated and we are awaiting family arrival to discuss goals of care. At the time of my evaluation patient had remained off of sedation since endotracheal intubation around 11:30 PM this evening.  Patient was hypotensive requiring peripherally infuse Levophed to maintain her mean arterial pressure.  Significant Hospital Events: Including procedures, antibiotic start and stop dates in addition to other pertinent events   10/26 - Presented to hospital with AMS >> Intubated by EDP 2023/04/11 declared brain dead   Interim History / Subjective:  Intubated on vent Likely transfer to [facility] hospital tomorrow for organ donation On 4 mcg levo  Objective   Blood pressure 114/66, pulse 81, temperature (!) 97 F (36.1 C), resp. rate (!) 24, height 5\' 2"  (1.575 m), weight 115.4 kg, SpO2 99%.    Vent Mode: PRVC FiO2 (%):  [40 %] 40 % Set Rate:  [30 bmp] 30 bmp Vt Set:  [400 mL] 400 mL PEEP:  [5 cmH20] 5 cmH20 Plateau Pressure:  [18 cmH20-20 cmH20] 18 cmH20   Intake/Output Summary (Last 24 hours) at 03/17/2023 0719 Last data filed at 03/17/2023 0700 Gross per 24 hour  Intake 3596.5 ml  Output 1830 ml  Net 1766.5 ml   Filed Weights   03/14/23 2246 03/16/23 0400  Weight: (!) 158.8 kg 115.4 kg    Examination: General:  critically ill appearing on mech vent HEENT: MM pink/moist; ETT in place Neuro: unresponsive; pupils fixed/dilated CV: s1s2, RRR, no m/r/g PULM:  dim clear BS bilaterally; on mech vent PRVC GI: soft, bsx4 active  Extremities: warm/dry, ble edema  Skin: no rashes or lesions   Assessment & Plan:   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  CT imaging shows midline shift, intraventricular extension, and herniation.  S/p aggressive measures with HTS infusion, now brain dead. Plan: -supportive care -plan for organ donation likely tomorrow  Shock: Likely secondary to herniation.   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  Secondary to intracerebral hemorrhage. Plan: -LTVV strategy with tidal volumes of 6-8 cc/kg ideal body weight -Wean PEEP/FiO2 for SpO2 >92% -VAP bundle in place  DMT2 Plan: -cont insulin drip -cbg monitoring  Hypokalemia Hypophosphatemia Plan: -replete electrolytes as needed   HTN Plan: -hold home anti-hypertensives  Skin lesion Plan: -punch biopsy on 10/28 per [facility]  Liver enzyme elevation Plan: -liver biopsy on 10/28 per [facility]  Best Practice (right click and "Reselect all SmartList Selections" daily)   Diet/type: NPO DVT prophylaxis: not indicated GI prophylaxis: PPI Lines: N/A Foley:  N/A Code Status:  DNR Last date of multidisciplinary goals of care discussion [10/27 family updated about brain death]  Labs   CBC: Recent Labs  Lab 03/14/23 2309 03/15/23 0155 03/15/23 2026 03/15/23 2336 03/16/23 0105 03/16/23 0218 03/16/23 1610 03/16/23 0621 03/16/23 1245 03/16/23 1421 03/16/23 1758 03/16/23 1800 03/17/23 0039 03/17/23 0505  WBC 7.1   < > 7.8  --  8.8  --  10.1  --  15.0*  --   --  17.6* 19.0*  --   NEUTROABS 5.0  --  4.5  --   --   --   --   --   --   --   --   --   --   --   HGB 11.6*   < > 11.1*   < > 10.5*   < > 9.7*   < > 9.7* 9.2* 9.5* 9.4* 8.9* 8.8*  HCT 38.5   < > 36.6   < > 35.4*   < > 32.4*   < > 32.3* 27.0* 28.0* 31.0* 29.8* 26.0*  MCV 91.2   < > 92.0  --  92.7  --  91.5  --  92.0  --   --  92.3 93.4  --   PLT 286   < > 276  --  234  --  202  --  210  --   --  213 200  --    < > = values in this interval not displayed.    Basic Metabolic Panel: Recent Labs  Lab 03/15/23 2026 03/15/23 2336 03/16/23 0105 03/16/23 0218 03/16/23 9604 03/16/23 0621 03/16/23 1245 03/16/23 1421 03/16/23 1758 03/16/23 1800 03/17/23 0039 03/17/23 0257 03/17/23 0505  NA  --    < > 153*   < > 157*   < > 156* 162* 162* 158* 157*   --  160*  K  --    < > 3.4*   < > 3.3*   < > 3.5 3.3* 3.4* 3.5 3.1*  --  3.5  CL  --   --  123*  --  127*  --  129*  --   --  129* 130*  --   --   CO2  --   --  20*  --  20*  --  20*  --   --  20* 21*  --   --   GLUCOSE  --   --  201*  --  204*  --  257*  --   --  255* 276*  --   --   BUN  --   --  16  --  15  --  15  --   --  16 18  --   --   CREATININE  --   --  1.26*  --  1.26*  --  1.36*  --   --  1.47* 1.44*  --   --   CALCIUM  --   --  8.8*  --  9.0  --  8.5*  --   --  8.9 8.9  --   --   MG 1.9  --   --   --   --   --   --   --   --   --   --  1.9  --   PHOS 2.0*  --   --   --   --   --   --   --   --   --   --  <1.0*  --    < > = values in this interval not displayed.   GFR: Estimated Creatinine Clearance: 43.1 mL/min (A) (by C-G formula based on SCr of 1.44 mg/dL (H)). Recent Labs  Lab 03/14/23 2309 03/15/23 0143 03/15/23 0402 03/15/23 1845 03/16/23 0105 03/16/23 0614 03/16/23 1245 03/16/23 1800 03/17/23 0039  WBC 7.1  --   --    < > 8.8 10.1 15.0* 17.6* 19.0*  LATICACIDVEN 2.2* 3.2* 1.8  --  2.0*  --   --   --   --    < > = values in this interval not displayed.    Liver Function Tests: Recent Labs  Lab 03/16/23 0105 03/16/23 0614 03/16/23 1245 03/16/23 1800 03/17/23 0039  AST 14* 14* 14* 15 19  ALT 9 11 10 12 13   ALKPHOS 83 69 72 74 70  BILITOT 0.7 1.0 0.9 0.8 0.3  PROT 7.3 7.0 7.1 7.1 7.0  ALBUMIN 2.3* 2.5* 2.7* 2.6* 2.3*   No results for input(s): "LIPASE", "AMYLASE" in the last 168 hours. No results for input(s): "AMMONIA" in the last 168 hours.  ABG    Component Value Date/Time   PHART 7.382 03/17/2023 0505   PCO2ART 33.2 03/17/2023 0505   PO2ART 159 (H) 03/17/2023 0505   HCO3 19.9 (L) 03/17/2023 0505   TCO2 21 (L) 03/17/2023 0505   ACIDBASEDEF 5.0 (H) 03/17/2023 0505   O2SAT 99 03/17/2023 0505     Coagulation Profile: Recent Labs  Lab 03/16/23 0614 03/16/23 1245 03/16/23 1800 03/17/23 0039 03/17/23 0631  INR 1.3* 1.4* 1.4* 1.5* 1.5*     Cardiac Enzymes: No results for input(s): "CKTOTAL", "CKMB", "CKMBINDEX", "TROPONINI" in the last 168 hours.  HbA1C: Hgb A1c MFr Bld  Date/Time Value Ref Range Status  03/15/2023 04:02 AM 6.2 (H) 4.8 - 5.6 % Final    Comment:    (NOTE) Pre diabetes:          5.7%-6.4%  Diabetes:              >6.4%  Glycemic control for   <7.0% adults with diabetes   12/08/2022 05:22 PM 6.6 (H) 4.8 - 5.6 % Final    Comment:    (NOTE) Pre diabetes:          5.7%-6.4%  Diabetes:              >6.4%  Glycemic control for   <7.0% adults with diabetes     CBG: Recent Labs  Lab 03/17/23 0139  03/17/23 0234 03/17/23 0356 03/17/23 0506 03/17/23 0624  GLUCAP 204* 179* 168* 164* 205*    IMAGING: PORT CXR 03/14/23 (personally reviewed by me):   Endotracheal tube approximately 2 cm from the carina.  No bowel fullness and right hilum and right lower lobe adjacent to the cardiac border.  Question possible consolidation.  Film rotated slightly to the right but trachea midline.  Normal mediastinal contour.  No pleural effusion.  No pneumothorax.  Enteric feeding tube courses below the diaphragm.  CT HEAD W/O CONTRAST 03/14/23 (per radiologist):   IMPRESSION: 1. Massive intraparenchymal hematoma centered in the left frontal lobe measuring 9.8 x 4.7 x 4.5 cm (103 mL). This causes 20 mm of rightward midline shift, subfalcine herniation and left uncal herniation. 2. Blood within the fourth ventricle.  Review of Systems:   Unable to obtain a review of systems given patient's altered mental status.  Past Medical History:   Past Medical History:  Diagnosis Date   COPD (chronic obstructive pulmonary disease) (HCC)    DM2 (diabetes mellitus, type 2) (HCC)    HTN (hypertension)    MCI (mild cognitive impairment)    Morbid obesity with BMI of 60.0-69.9, adult (HCC)    Surgical History:  Unable to obtain a surgical history given patient's altered mental status.  Social History:   Social  History   Socioeconomic History   Marital status: Single    Spouse name: Not on file   Number of children: Not on file   Years of education: Not on file   Highest education level: Not on file  Occupational History   Not on file  Tobacco Use   Smoking status: Unknown   Smokeless tobacco: Never  Vaping Use   Vaping status: Never Used  Substance and Sexual Activity   Alcohol use: Not Currently   Drug use: Not Currently   Sexual activity: Not Currently  Other Topics Concern   Not on file  Social History Narrative   Not on file   Social Determinants of Health   Financial Resource Strain: Not on file  Food Insecurity: No Food Insecurity (09/20/2022)   Hunger Vital Sign    Worried About Running Out of Food in the Last Year: Never true    Ran Out of Food in the Last Year: Never true  Transportation Needs: No Transportation Needs (09/20/2022)   PRAPARE - Administrator, Civil Service (Medical): No    Lack of Transportation (Non-Medical): No  Physical Activity: Not on file  Stress: Not on file  Social Connections: Not on file    Family History:  Unable to obtain a family medical history given patient's altered mental status.  Allergies No Known Allergies   Home Medications  Prior to Admission medications   Medication Sig Start Date End Date Taking? Authorizing Provider  acetaminophen (TYLENOL) 500 MG tablet Take 1,000 mg by mouth 2 (two) times daily as needed for mild pain (pain score 1-3) or headache.   Yes [provider]  losartan (COZAAR) 25 MG tablet Take 25 mg by mouth daily. 02/10/23  Yes [provider]  nitrofurantoin, macrocrystal-monohydrate, (MACROBID) 100 MG capsule Take 100 mg by mouth 2 (two) times daily. 03/09/23  Yes [provider]  albuterol (VENTOLIN HFA) 108 (90 Base) MCG/ACT inhaler Inhale 2 puffs into the lungs every 6 (six) hours as needed for shortness of breath. 09/01/22   [provider]  aspirin 81 MG  chewable tablet Chew 81 mg by mouth daily.  [provider]  aspirin-acetaminophen-caffeine (EXCEDRIN MIGRAINE) [phone] MG tablet Take 1 tablet by mouth daily as needed for headache or migraine.    [provider]  atorvastatin (LIPITOR) 10 MG tablet Take 10 mg by mouth at bedtime. 08/10/22   [provider]  Benzocaine-Menthol (CEPACOL SORE THROAT) 10-2.1 MG LOZG Use as directed 1 lozenge in the mouth or throat daily.    [provider]  budesonide (PULMICORT) 0.5 MG/2ML nebulizer solution Take 0.5 mg by nebulization 2 times daily at 12 noon and 4 pm. 09/09/22   [provider]  calcium carbonate (TUMS - DOSED IN MG ELEMENTAL CALCIUM) 500 MG chewable tablet Chew 2 tablets by mouth daily.    [provider]  Cholecalciferol 50 MCG (2000 UT) TABS Take 2,000 Units by mouth daily.    [provider]  cloNIDine (CATAPRES) 0.1 MG tablet Take 0.1 mg by mouth every 12 (twelve) hours as needed (hypertension). If SBP > 160 or DBP >100. If BP >160/100 1 hour after administration call MD.    [provider]  cycloSPORINE (RESTASIS) 0.05 % ophthalmic emulsion Place 2 drops into both eyes 2 (two) times daily. 07/26/22   [provider]  diclofenac Sodium (VOLTAREN) 1 % GEL Apply 4 g topically daily. To bilateral hands 07/04/22   [provider]  DULoxetine (CYMBALTA) 60 MG capsule Take 60 mg by mouth daily. 08/10/22   [provider]  famotidine (PEPCID) 20 MG tablet Take 20 mg by mouth daily. 08/10/22   [provider]  feeding supplement (ENSURE ENLIVE / ENSURE PLUS) LIQD Take 237 mLs by mouth 3 (three) times daily between meals. Patient not taking: Reported on 12/08/2022 09/20/22   Champ Mungo, DO  ferrous sulfate 325 (65 FE) MG tablet Take 325 mg by mouth daily with breakfast.    [provider]  fluticasone (FLONASE) 50 MCG/ACT nasal spray Place 1 spray into both nostrils daily.    [provider]  folic acid (FOLVITE) 1 MG tablet Take 1 mg by mouth daily.    [provider]  gabapentin (NEURONTIN) 800 MG tablet Take 800 mg by mouth 2 (two) times daily. 08/10/22   [provider]  ipratropium (ATROVENT) 0.02 % nebulizer solution Take 0.5 mg by nebulization in the morning, at noon, and at bedtime. 09/03/22   [provider]  lidocaine (HM LIDOCAINE PATCH) 4 % Place 0.5 patches onto the skin daily.    [provider]  Menthol, Topical Analgesic, (BIOFREEZE PROFESSIONAL) 5 % GEL Apply 1 Application topically in the morning and at bedtime. To left arm & shoulder    [provider]  methotrexate (RHEUMATREX) 2.5 MG tablet Take 5 mg by mouth once a week. Caution:Chemotherapy. Protect from light. Tuesdays    [provider]  montelukast (SINGULAIR) 10 MG tablet Take 10 mg by mouth daily. 08/10/22   [provider]  oxyCODONE (OXY IR/ROXICODONE) 5 MG immediate release tablet Take 1 tablet (5 mg total) by mouth 2 (two) times daily. 12/15/22   Elgergawy, Leana Roe, MD  OZEMPIC, 0.25 OR 0.5 MG/DOSE, 2 MG/3ML SOPN Inject 0.5 mg into the skin once a week. Tuesdays 08/13/22   [provider]  polyethylene glycol (MIRALAX / GLYCOLAX) 17 g packet Take 17 g by mouth daily.    [provider]  sennosides-docusate sodium (SENOKOT-S) 8.6-50 MG tablet Take 2 tablets by mouth daily.    [provider]  tiZANidine (ZANAFLEX) 4 MG tablet Take 2 mg  by mouth in the morning and at bedtime. 08/10/22   [provider]  topiramate (TOPAMAX) 25 MG tablet Take 25 mg by mouth 2 (two) times daily. 08/10/22   [provider]  Zinc Oxide (BAZA PROTECT MOISTURE BARRIER EX) Apply 1 Application topically in the morning and at bedtime.    [provider]     Critical care time: 35 minutes    JD Daryel [facility] 03/17/2023, 7:27 AM  Please see [email or web address] for pager  details.  From 7A-7P if no response, please call [phone]. After hours, please call ELink [phone].

## 2023-03-17 NOTE — Plan of Care (Signed)
  Problem: Education: Goal: Knowledge of disease or condition will improve 03/17/2023 1732 by Toniann Fail, RN Outcome: Not Met (add Reason) 03/17/2023 1731 by Toniann Fail, RN Outcome: Not Met (add Reason)   Problem: Self-Care: Goal: Ability to participate in self-care as condition permits will improve 03/17/2023 1732 by Toniann Fail, RN Outcome: Not Met (add Reason) 03/17/2023 1731 by Toniann Fail, RN Outcome: Not Met (add Reason)   Problem: Self-Care: Goal: Verbalization of feelings and concerns over difficulty with self-care will improve 03/17/2023 1732 by Toniann Fail, RN Outcome: Not Met (add Reason) 03/17/2023 1731 by Toniann Fail, RN Outcome: Not Met (add Reason)   Problem: Self-Care: Goal: Ability to communicate needs accurately will improve 03/17/2023 1732 by Toniann Fail, RN Outcome: Not Met (add Reason) 03/17/2023 1731 by Toniann Fail, RN Outcome: Not Met (add Reason)   Problem: Role Relationship: Goal: Method of communication will improve 03/17/2023 1732 by Toniann Fail, RN Outcome: Not Met (add Reason) 03/17/2023 1731 by Toniann Fail, RN Outcome: Not Met (add Reason)

## 2023-03-17 NOTE — Progress Notes (Addendum)
Pharmacy Antibiotic Note  Jeralyn Fasching is a 72 y.o. female admitted on 03/14/2023 with bacteremia.  Pharmacy has been consulted for vancomycin and cefepime dosing. Per [facility] request, will treat MRSE bacteremia despite brain death.   Last Scr 1.44; Last vancomycin 10/27 vancomycin 1500mg  IV x1 @ 0033   Plan: Vancomycin 1500mg  IV x 1  Vanc Random tomorrow AM to guide dosing F/u [facility] plans  Continue cefepime 2g IV q12h  Height: 5\' 2"  (157.5 cm) Weight: 115.4 kg (254 lb 6.6 oz) IBW/kg (Calculated) : 50.1  Temp (24hrs), Avg:98.3 F (36.8 C), Min:96.3 F (35.7 C), Max:99.9 F (37.7 C)  Recent Labs  Lab 03/14/23 2309 03/15/23 0143 03/15/23 0402 03/15/23 1845 03/16/23 0105 03/16/23 0614 03/16/23 1245 03/16/23 1800 03/17/23 0039  WBC 7.1  --   --    < > 8.8 10.1 15.0* 17.6* 19.0*  CREATININE 0.98  --  1.12*   < > 1.26* 1.26* 1.36* 1.47* 1.44*  LATICACIDVEN 2.2* 3.2* 1.8  --  2.0*  --   --   --   --    < > = values in this interval not displayed.    Estimated Creatinine Clearance: 43.1 mL/min (A) (by C-G formula based on SCr of 1.44 mg/dL (H)).    No Known Allergies  Antimicrobials this admission: Vanc 10/27x1; 10/29> Cefepime 10/27>  Dose adjustments this admission:   Microbiology results: 10/27 BAL GPC In clusters 10/27 BCX: 3/4 MRSE 10/27 MRSA pos 10/28 COVID neg   Thank you for allowing pharmacy to be a part of this patient's care.  Calton Dach, PharmD, BCCCP Clinical Pharmacist 03/17/2023 9:55 AM

## 2023-03-18 ENCOUNTER — Other Ambulatory Visit (HOSPITAL_COMMUNITY): Payer: Self-pay

## 2023-03-18 DIAGNOSIS — J9600 Acute respiratory failure, unspecified whether with hypoxia or hypercapnia: Secondary | ICD-10-CM | POA: Diagnosis not present

## 2023-03-18 DIAGNOSIS — I611 Nontraumatic intracerebral hemorrhage in hemisphere, cortical: Secondary | ICD-10-CM | POA: Diagnosis not present

## 2023-03-18 LAB — POCT I-STAT 7, (LYTES, BLD GAS, ICA,H+H)
Acid-base deficit: 8 mmol/L — ABNORMAL HIGH (ref 0.0–2.0)
Acid-base deficit: 7 mmol/L — ABNORMAL HIGH (ref 0.0–2.0)
Acid-base deficit: 7 mmol/L — ABNORMAL HIGH (ref 0.0–2.0)
Bicarbonate: 17 mmol/L — ABNORMAL LOW (ref 20.0–28.0)
Bicarbonate: 19 mmol/L — ABNORMAL LOW (ref 20.0–28.0)
Bicarbonate: 19 mmol/L — ABNORMAL LOW (ref 20.0–28.0)
Calcium, Ion: 1.27 mmol/L (ref 1.15–1.40)
Calcium, Ion: 1.26 mmol/L (ref 1.15–1.40)
Calcium, Ion: 1.26 mmol/L (ref 1.15–1.40)
HCT: 22 % — ABNORMAL LOW (ref 36.0–46.0)
HCT: 25 % — ABNORMAL LOW (ref 36.0–46.0)
HCT: 25 % — ABNORMAL LOW (ref 36.0–46.0)
Hemoglobin: 7.5 g/dL — ABNORMAL LOW (ref 12.0–15.0)
Hemoglobin: 8.5 g/dL — ABNORMAL LOW (ref 12.0–15.0)
Hemoglobin: 8.5 g/dL — ABNORMAL LOW (ref 12.0–15.0)
O2 Saturation: 99 %
O2 Saturation: 99 %
O2 Saturation: 99 %
Patient temperature: 36.3
Patient temperature: 36.4
Patient temperature: 36.4
Potassium: 3.4 mmol/L — ABNORMAL LOW (ref 3.5–5.1)
Potassium: 3.6 mmol/L (ref 3.5–5.1)
Potassium: 3.6 mmol/L (ref 3.5–5.1)
Sodium: 153 mmol/L — ABNORMAL HIGH (ref 135–145)
Sodium: 149 mmol/L — ABNORMAL HIGH (ref 135–145)
Sodium: 149 mmol/L — ABNORMAL HIGH (ref 135–145)
TCO2: 18 mmol/L — ABNORMAL LOW (ref 22–32)
TCO2: 20 mmol/L — ABNORMAL LOW (ref 22–32)
TCO2: 20 mmol/L — ABNORMAL LOW (ref 22–32)
pCO2 arterial: 29.1 mm[Hg] — ABNORMAL LOW (ref 32–48)
pCO2 arterial: 37.2 mm[Hg] (ref 32–48)
pCO2 arterial: 37.2 mm[Hg] (ref 32–48)
pH, Arterial: 7.372 (ref 7.35–7.45)
pH, Arterial: 7.313 — ABNORMAL LOW (ref 7.35–7.45)
pH, Arterial: 7.313 — ABNORMAL LOW (ref 7.35–7.45)
pO2, Arterial: 154 mm[Hg] — ABNORMAL HIGH (ref 83–108)
pO2, Arterial: 173 mm[Hg] — ABNORMAL HIGH (ref 83–108)
pO2, Arterial: 173 mm[Hg] — ABNORMAL HIGH (ref 83–108)

## 2023-03-18 LAB — URINALYSIS, W/ REFLEX TO CULTURE (INFECTION SUSPECTED)
Bilirubin Urine: NEGATIVE
Glucose, UA: NEGATIVE mg/dL
Ketones, ur: NEGATIVE mg/dL
Nitrite: NEGATIVE
Protein, ur: NEGATIVE mg/dL
Specific Gravity, Urine: 1.012 (ref 1.005–1.030)
WBC, UA: >50 WBC/hpf (ref 0–5)
pH: 5 (ref 5.0–8.0)

## 2023-03-18 LAB — CBC WITH DIFFERENTIAL/PLATELET
Abs Immature Granulocytes: 0.14 10*3/uL — ABNORMAL HIGH (ref 0.00–0.07)
Abs Immature Granulocytes: 0.23 10*3/uL — ABNORMAL HIGH (ref 0.00–0.07)
Basophils Absolute: 0 10*3/uL (ref 0.0–0.1)
Basophils Absolute: 0 10*3/uL (ref 0.0–0.1)
Basophils Relative: 0 %
Basophils Relative: 0 %
Eosinophils Absolute: 0 10*3/uL (ref 0.0–0.5)
Eosinophils Absolute: 0 10*3/uL (ref 0.0–0.5)
Eosinophils Relative: 0 %
Eosinophils Relative: 0 %
HCT: 25 % — ABNORMAL LOW (ref 36.0–46.0)
HCT: 25.9 % — ABNORMAL LOW (ref 36.0–46.0)
Hemoglobin: 7.7 g/dL — ABNORMAL LOW (ref 12.0–15.0)
Hemoglobin: 7.8 g/dL — ABNORMAL LOW (ref 12.0–15.0)
Immature Granulocytes: 1 %
Immature Granulocytes: 2 %
Lymphocytes Relative: 12 %
Lymphocytes Relative: 16 %
Lymphs Abs: 1.9 10*3/uL (ref 0.7–4.0)
Lymphs Abs: 1.9 10*3/uL (ref 0.7–4.0)
MCH: 27.4 pg (ref 26.0–34.0)
MCH: 28.2 pg (ref 26.0–34.0)
MCHC: 30.1 g/dL (ref 30.0–36.0)
MCHC: 30.8 g/dL (ref 30.0–36.0)
MCV: 90.9 fL (ref 80.0–100.0)
MCV: 91.6 fL (ref 80.0–100.0)
Monocytes Absolute: 0.6 10*3/uL (ref 0.1–1.0)
Monocytes Absolute: 0.8 10*3/uL (ref 0.1–1.0)
Monocytes Relative: 5 %
Monocytes Relative: 5 %
Neutro Abs: 12.4 10*3/uL — ABNORMAL HIGH (ref 1.7–7.7)
Neutro Abs: 9.3 10*3/uL — ABNORMAL HIGH (ref 1.7–7.7)
Neutrophils Relative %: 78 %
Neutrophils Relative %: 81 %
Platelets: 143 10*3/uL — ABNORMAL LOW (ref 150–400)
Platelets: 155 10*3/uL (ref 150–400)
RBC: 2.73 MIL/uL — ABNORMAL LOW (ref 3.87–5.11)
RBC: 2.85 MIL/uL — ABNORMAL LOW (ref 3.87–5.11)
RDW: 17.1 % — ABNORMAL HIGH (ref 11.5–15.5)
RDW: 17.2 % — ABNORMAL HIGH (ref 11.5–15.5)
WBC: 12 10*3/uL — ABNORMAL HIGH (ref 4.0–10.5)
WBC: 15.4 10*3/uL — ABNORMAL HIGH (ref 4.0–10.5)
nRBC: 0 % (ref 0.0–0.2)
nRBC: 0 % (ref 0.0–0.2)

## 2023-03-18 LAB — CULTURE, BLOOD (ROUTINE X 2): Special Requests: ADEQUATE

## 2023-03-18 LAB — CBC
HCT: 24.1 % — ABNORMAL LOW (ref 36.0–46.0)
Hemoglobin: 7.2 g/dL — ABNORMAL LOW (ref 12.0–15.0)
MCH: 27.4 pg (ref 26.0–34.0)
MCHC: 29.9 g/dL — ABNORMAL LOW (ref 30.0–36.0)
MCV: 91.6 fL (ref 80.0–100.0)
Platelets: 144 10*3/uL — ABNORMAL LOW (ref 150–400)
RBC: 2.63 MIL/uL — ABNORMAL LOW (ref 3.87–5.11)
RDW: 17 % — ABNORMAL HIGH (ref 11.5–15.5)
WBC: 11 10*3/uL — ABNORMAL HIGH (ref 4.0–10.5)
nRBC: 0.2 % (ref 0.0–0.2)

## 2023-03-18 LAB — BLOOD GAS, ARTERIAL
Acid-base deficit: 4.9 mmol/L — ABNORMAL HIGH (ref 0.0–2.0)
Bicarbonate: 19.4 mmol/L — ABNORMAL LOW (ref 20.0–28.0)
Drawn by: 7328
O2 Saturation: 100 %
Patient temperature: 37.2
pCO2 arterial: 32 mm[Hg] (ref 32–48)
pH, Arterial: 7.39 (ref 7.35–7.45)
pO2, Arterial: 156 mm[Hg] — ABNORMAL HIGH (ref 83–108)

## 2023-03-18 LAB — COMPREHENSIVE METABOLIC PANEL
ALT: 14 U/L (ref 0–44)
ALT: 15 U/L (ref 0–44)
ALT: 16 U/L (ref 0–44)
AST: 14 U/L — ABNORMAL LOW (ref 15–41)
AST: 16 U/L (ref 15–41)
AST: 17 U/L (ref 15–41)
Albumin: 2 g/dL — ABNORMAL LOW (ref 3.5–5.0)
Albumin: 2.1 g/dL — ABNORMAL LOW (ref 3.5–5.0)
Albumin: 2.1 g/dL — ABNORMAL LOW (ref 3.5–5.0)
Alkaline Phosphatase: 81 U/L (ref 38–126)
Alkaline Phosphatase: 83 U/L (ref 38–126)
Alkaline Phosphatase: 84 U/L (ref 38–126)
Anion gap: 5 (ref 5–15)
Anion gap: 9 (ref 5–15)
Anion gap: 9 (ref 5–15)
BUN: 19 mg/dL (ref 8–23)
BUN: 21 mg/dL (ref 8–23)
BUN: 21 mg/dL (ref 8–23)
CO2: 18 mmol/L — ABNORMAL LOW (ref 22–32)
CO2: 18 mmol/L — ABNORMAL LOW (ref 22–32)
CO2: 20 mmol/L — ABNORMAL LOW (ref 22–32)
Calcium: 8.2 mg/dL — ABNORMAL LOW (ref 8.9–10.3)
Calcium: 8.3 mg/dL — ABNORMAL LOW (ref 8.9–10.3)
Calcium: 8.5 mg/dL — ABNORMAL LOW (ref 8.9–10.3)
Chloride: 121 mmol/L — ABNORMAL HIGH (ref 98–111)
Chloride: 122 mmol/L — ABNORMAL HIGH (ref 98–111)
Chloride: 123 mmol/L — ABNORMAL HIGH (ref 98–111)
Creatinine, Ser: 1.1 mg/dL — ABNORMAL HIGH (ref 0.44–1.00)
Creatinine, Ser: 1.16 mg/dL — ABNORMAL HIGH (ref 0.44–1.00)
Creatinine, Ser: 1.22 mg/dL — ABNORMAL HIGH (ref 0.44–1.00)
GFR, Estimated: 47 mL/min — ABNORMAL LOW (ref >60)
GFR, Estimated: 50 mL/min — ABNORMAL LOW (ref >60)
GFR, Estimated: 54 mL/min — ABNORMAL LOW (ref >60)
Glucose, Bld: 151 mg/dL — ABNORMAL HIGH (ref 70–99)
Glucose, Bld: 74 mg/dL (ref 70–99)
Glucose, Bld: 98 mg/dL (ref 70–99)
Potassium: 3.5 mmol/L (ref 3.5–5.1)
Potassium: 3.6 mmol/L (ref 3.5–5.1)
Potassium: 3.6 mmol/L (ref 3.5–5.1)
Sodium: 147 mmol/L — ABNORMAL HIGH (ref 135–145)
Sodium: 148 mmol/L — ABNORMAL HIGH (ref 135–145)
Sodium: 150 mmol/L — ABNORMAL HIGH (ref 135–145)
Total Bilirubin: 0.3 mg/dL (ref 0.3–1.2)
Total Bilirubin: 0.4 mg/dL (ref 0.3–1.2)
Total Bilirubin: 0.5 mg/dL (ref 0.3–1.2)
Total Protein: 6.3 g/dL — ABNORMAL LOW (ref 6.5–8.1)
Total Protein: 6.5 g/dL (ref 6.5–8.1)
Total Protein: 6.7 g/dL (ref 6.5–8.1)

## 2023-03-18 LAB — CULTURE, RESPIRATORY W GRAM STAIN

## 2023-03-18 LAB — PHOSPHORUS
Phosphorus: 2.6 mg/dL (ref 2.5–4.6)
Phosphorus: 2.7 mg/dL (ref 2.5–4.6)
Phosphorus: 2.9 mg/dL (ref 2.5–4.6)
Phosphorus: 2.9 mg/dL (ref 2.5–4.6)

## 2023-03-18 LAB — PROTIME-INR
INR: 1.4 — ABNORMAL HIGH (ref 0.8–1.2)
INR: 1.4 — ABNORMAL HIGH (ref 0.8–1.2)
INR: 1.5 — ABNORMAL HIGH (ref 0.8–1.2)
Prothrombin Time: 17.3 s — ABNORMAL HIGH (ref 11.4–15.2)
Prothrombin Time: 17.4 s — ABNORMAL HIGH (ref 11.4–15.2)
Prothrombin Time: 18 s — ABNORMAL HIGH (ref 11.4–15.2)

## 2023-03-18 LAB — APTT
aPTT: 36 s (ref 24–36)
aPTT: 37 s — ABNORMAL HIGH (ref 24–36)
aPTT: 36 s (ref 24–36)
aPTT: 35 s (ref 24–36)

## 2023-03-18 LAB — MAGNESIUM
Magnesium: 2 mg/dL (ref 1.7–2.4)
Magnesium: 2.1 mg/dL (ref 1.7–2.4)
Magnesium: 2.1 mg/dL (ref 1.7–2.4)
Magnesium: 2.1 mg/dL (ref 1.7–2.4)

## 2023-03-18 LAB — BILIRUBIN, DIRECT
Bilirubin, Direct: 0.1 mg/dL (ref 0.0–0.2)
Bilirubin, Direct: 0.2 mg/dL (ref 0.0–0.2)
Bilirubin, Direct: <0.1 mg/dL (ref 0.0–0.2)

## 2023-03-18 LAB — GLUCOSE, CAPILLARY
Glucose-Capillary: 103 mg/dL — ABNORMAL HIGH (ref 70–99)
Glucose-Capillary: 146 mg/dL — ABNORMAL HIGH (ref 70–99)
Glucose-Capillary: 76 mg/dL (ref 70–99)
Glucose-Capillary: 88 mg/dL (ref 70–99)

## 2023-03-18 LAB — LACTIC ACID, PLASMA: Lactic Acid, Venous: 0.8 mmol/L (ref 0.5–1.9)

## 2023-03-18 LAB — VANCOMYCIN, RANDOM: Vancomycin Rm: 22 ug/mL

## 2023-03-18 MED ORDER — SODIUM CHLORIDE 0.9 % IV SOLN
10.0000 ug/h | INTRAVENOUS | Status: DC
  Administered 2023-03-18: 10 ug/h via INTRAVENOUS
  Filled 2023-03-18: qty 10

## 2023-03-18 MED ORDER — DEXTROSE IN LACTATED RINGERS 5 % IV SOLN: 50.0000 mL/h | INTRAVENOUS | Status: AC

## 2023-03-18 MED ORDER — VASOPRESSIN 20 UNITS/100 ML INFUSION FOR SHOCK: 0.0000 [IU]/min | INTRAVENOUS | Status: AC

## 2023-03-18 MED ORDER — SODIUM CHLORIDE 0.9 % IV SOLN: 10.0000 ug/h | INTRAVENOUS | Status: AC

## 2023-03-18 MED ORDER — SENNOSIDES-DOCUSATE SODIUM 8.6-50 MG PO TABS: 1.0000 | ORAL_TABLET | Freq: Two times a day (BID) | ORAL | Status: AC

## 2023-03-18 MED ORDER — ALBUMIN HUMAN 5 % IV SOLN
12.5000 g | Freq: Once | INTRAVENOUS | Status: AC
  Administered 2023-03-18: 12.5 g via INTRAVENOUS
  Filled 2023-03-18: qty 250

## 2023-03-18 MED ORDER — FREE WATER: 300.0000 mL | Status: AC

## 2023-03-18 MED ORDER — VANCOMYCIN HCL 500 MG/100ML IV SOLN: 500.0000 mg | Freq: Once | INTRAVENOUS | Status: AC

## 2023-03-18 MED ORDER — MUPIROCIN 2 % EX OINT
TOPICAL_OINTMENT | Freq: Two times a day (BID) | CUTANEOUS | 0 refills | Status: AC
  Filled 2023-03-18: qty 22, 30d supply, fill #0

## 2023-03-18 MED ORDER — VASOPRESSIN 20 UNITS/100 ML INFUSION FOR SHOCK
0.0000 [IU]/min | INTRAVENOUS | Status: DC
  Administered 2023-03-18: 0.01 [IU]/min via INTRAVENOUS
  Filled 2023-03-18: qty 100

## 2023-03-18 MED ORDER — DEXTROSE 50 % IV SOLN: 0.0000 mL | INTRAVENOUS | Status: AC | PRN

## 2023-03-18 MED ORDER — IPRATROPIUM-ALBUTEROL 0.5-2.5 (3) MG/3ML IN SOLN: 3.0000 mL | RESPIRATORY_TRACT | Status: AC | PRN

## 2023-03-18 MED ORDER — IPRATROPIUM-ALBUTEROL 0.5-2.5 (3) MG/3ML IN SOLN: 3.0000 mL | RESPIRATORY_TRACT | Status: DC | PRN

## 2023-03-18 MED ORDER — SODIUM CHLORIDE 0.9 % IV SOLN: 2.0000 g | Freq: Two times a day (BID) | INTRAVENOUS | Status: AC

## 2023-03-18 MED ORDER — NOREPINEPHRINE 4 MG/250ML-% IV SOLN: 0.0000 ug/min | INTRAVENOUS | Status: AC

## 2023-03-18 MED ORDER — VANCOMYCIN HCL 500 MG/100ML IV SOLN
500.0000 mg | Freq: Once | INTRAVENOUS | Status: AC
  Administered 2023-03-18: 500 mg via INTRAVENOUS
  Filled 2023-03-18: qty 100

## 2023-03-18 MED ORDER — FAMOTIDINE 20 MG PO TABS: 20.0000 mg | ORAL_TABLET | Freq: Two times a day (BID) | ORAL | Status: AC

## 2023-03-18 MED ORDER — ACETAMINOPHEN 160 MG/5ML PO SOLN: 650.0000 mg | ORAL | Status: AC | PRN

## 2023-03-18 NOTE — Progress Notes (Signed)
Danajeanette Gillette-Phillips updated via telephone about patient transfer to [facility]

## 2023-03-18 NOTE — Progress Notes (Signed)
Report given to [employer] RN

## 2023-03-18 NOTE — Discharge Summary (Signed)
Physician Discharge Summary         Patient ID: Michelle Gay MRN: [id number] DOB/AGE: 71-Mar-1952 71 y.o.  Admit date: 03/14/2023 Discharge date: 03/18/2023  Discharge Diagnoses:    Active Hospital Problems   Diagnosis Date Noted   ICH (intracerebral hemorrhage) (HCC) 03/15/2023   Acute respiratory failure (HCC) 03/17/2023    Resolved Hospital Problems  No resolved problems to display.      Discharge summary    Patient admitted to [facility] on 10/26 w/ AMS. Patient was found at 9:15 PM on 10/26 after dinner unresponsive. Patient was transported to the emergency department around 10:42 PM this evening from her skilled facility by EMS. Notably does take pain medication and Narcan was administered without improvement. Patient was documented as normal around 7:30 PM. Upon EMS arrival patient was unresponsive. Patient arrived to the emergency department on nonrebreather mask with sonorous respirations. With altered mental status patient was also noted to have no gag reflex. Patient was subsequently endotracheally intubated by ED provider. In the emergency department the patient's blood pressure has fluctuated. She has also been somewhat hypothermic. CT imaging of the brain revealed a "massive intraparenchymal hematoma" centered in the left frontal lobe resulting in rightward midline shift with subfalcine and left uncal herniation as well as extensive edema and hemorrhage within the fourth ventricle. Patient was evaluated by neurology.  PCCM asked to consult and assist neurology with the patient management as she is now intubated and we are awaiting family arrival to discuss goals of care. At the time of my evaluation patient had remained off of sedation since endotracheal intubation around 11:30 PM this evening. Patient was hypotensive requiring peripherally infuse Levophed to maintain her mean arterial pressure. Cultures obtained and started on broad spectrum abx. Cultures showing  MRSE bacteremia.   On 10/27, patient remained unresponsive and pupils fixed dilated despite being off sedation. Also not triggering breaths on ventilator. 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 Family notified but okay for [facility] to be consulted for possible organ donation.  10/30 plan to discharge to [facility] per [facility] for organ donation.   Discharge Plan by Active Problems    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  CT imaging shows midline shift, intraventricular extension, and herniation.  S/p aggressive measures with HTS infusion, now brain dead. Plan: -supportive care -plan transport to [facility] today for organ donation   Shock: Likely secondary to herniation.   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  Secondary to intracerebral hemorrhage. Plan: -LTVV strategy with tidal volumes of 6-8 cc/kg ideal body weight -Wean PEEP/FiO2 for SpO2 >92% -VAP bundle in place   DMT2 Plan: -currently off insulin drip -cbg monitoring   Hypokalemia Hypophosphatemia Hypernatremia: iatrogenic w/ hts administration AKI Plan: -cont free water; started on D5 per [facility] -Trend BMP / phosph / mag / urinary output -Replace electrolytes as indicated -Avoid nephrotoxic agents, ensure adequate renal perfusion   HTN Plan: -hold home anti-hypertensives   Skin lesion Plan: -punch biopsy on 10/28 per [facility]   Liver enzyme elevation Plan: -liver biopsy on 10/28 per [facility]     Significant Hospital tests/ studies   Procedures    Culture data/antimicrobials   -Bcx2 10/27 staph epi; repeat bcx2 10/29 no growth -BAL 10/27 moderate staph aureus and few proteus mirabilis   Consults      Discharge Exam: BP Marland Kitchen)  130/59 (BP Location: Left Leg)   Pulse 89   Temp 97.9 F (36.6 C)   Resp (!) 26   Ht 5\' 2"  (1.575 m)   Wt  115.4 kg   SpO2 100%   BMI 46.53 kg/m   General:  critically ill appearing on mech vent HEENT: MM pink/moist; ETT in place Neuro: unresponsive; pupils fixed/dilated CV: s1s2, RRR, no m/r/g PULM:  dim clear BS bilaterally; on mech vent PRVC GI: soft, bsx4 active  Extremities: warm/dry, ble edema  Skin: no rashes or lesions   Labs at discharge   Lab Results  Component Value Date   CREATININE 1.16 (H) 03/18/2023   BUN 21 03/18/2023   NA 148 (H) 03/18/2023   K 3.6 03/18/2023   CL 121 (H) 03/18/2023   CO2 18 (L) 03/18/2023   Lab Results  Component Value Date   WBC 12.0 (H) 03/18/2023   HGB 7.5 (L) 03/18/2023   HCT 22.0 (L) 03/18/2023   MCV 91.6 03/18/2023   PLT 143 (L) 03/18/2023   Lab Results  Component Value Date   ALT 16 03/18/2023   AST 17 03/18/2023   ALKPHOS 84 03/18/2023   BILITOT 0.4 03/18/2023   Lab Results  Component Value Date   INR 1.4 (H) 03/18/2023   INR 1.4 (H) 03/18/2023   INR 1.5 (H) 03/18/2023    Current radiological studies    DG Chest 1 View  Result Date: 03/16/2023 CLINICAL DATA:  Intubated EXAM: CHEST  1 VIEW COMPARISON:  12/08/2022 FINDINGS: Endotracheal tube tip is about 2.9 cm superior to carina. Esophageal tube tip below the diaphragm but incompletely visualized. Mild cardiac enlargement. Mild lung base airspace disease. No pleural effusion or pneumothorax. IMPRESSION: Endotracheal tube tip about 2.9 cm superior to carina. Mild lung base airspace disease, atelectasis versus pneumonia. Electronically Signed   By: Jasmine Pang M.D.   On: 03/16/2023 20:59   US BIOPSY (LIVER)  Result Date: 03/16/2023 INDICATION: 72 year old female with history of intracranial hemorrhage, evaluation for donor candidacy. EXAM: ULTRASOUND BIOPSY CORE LIVER MEDICATIONS: None. ANESTHESIA/SEDATION: None. COMPLICATIONS: None immediate. PROCEDURE: Informed written consent was obtained from the patient after a thorough discussion of the procedural risks, benefits and  alternatives. All questions were addressed. Maximal Sterile Barrier Technique was utilized including caps, mask, sterile gowns, sterile gloves, sterile drape, hand hygiene and skin antiseptic. A timeout was performed prior to the initiation of the procedure. Preprocedure ultrasound demonstrated safe window in the right upper quadrant for nonfocal liver biopsy. The subxiphoid region was prepped and draped in standard fashion. Local anesthesia was administered subdermally at the planned entry site as well as under ultrasound guidance along the hepatic capsule. A skin nick was made. A 17 gauge introducer needle was advanced to the hepatic parenchyma under ultrasound guidance. Next, a total of 2, 18 gauge core biopsies were obtained. The samples were placed in saline and sent to Pathology. Under ultrasound guidance, a Gel-Foam slurry was administered along the needle entry tract as the introducer needle was withdrawn. Postprocedure ultrasound demonstrated no evidence of perihepatic fluid collection. The patient tolerated the procedure well. IMPRESSION: Technically successful ultrasound-guided nonfocal core liver biopsy from the left lobe of the liver. Marliss Coots, MD [facility] Radiology Electronically Signed   By: Marliss Coots M.D.   On: 03/16/2023 14:59    Disposition:    Discharge disposition: [facility] Not Defined       Discharge Instructions     [facility] pharmacist to adjust dose for Vancomycin, Aminoglycosides and other anti-infective therapies as requested by physician.   Complete by: As directed    [facility] to provide Cath Flo 2mg    Complete by: As directed    Administer for PICC line occlusion and as ordered by physician for other access device issues.   Anaphylaxis Kit: Provided to treat any anaphylactic reaction to the medication being provided to the patient if First Dose or when  requested by physician   Complete by: As directed    Epinephrine 1mg /ml vial / amp: Administer 0.3mg  (0.46ml) subcutaneously once for moderate to severe anaphylaxis, nurse to call physician and pharmacy when reaction occurs and call 911 if needed for immediate care   Diphenhydramine 50mg /ml IV vial: Administer 25-50mg  IV/IM PRN for first dose reaction, rash, itching, mild reaction, nurse to call physician and pharmacy when reaction occurs   Sodium Chloride 0.9% NS IV: Administer if needed for hypovolemic blood pressure drop or as ordered by physician after call to physician with anaphylactic reaction   Change dressing on IV access line weekly and PRN   Complete by: As directed    Flush IV access with Sodium Chloride 0.9% and Heparin 10 units/ml or 100 units/ml   Complete by: As directed    Home infusion instructions - [facility]   Complete by: As directed    Instructions: Flush IV access with Sodium Chloride 0.9% and Heparin 10units/ml or 100units/ml   Change dressing on IV access line: Weekly and PRN   Instructions Cath Flo 2mg : Administer for PICC Line occlusion and as ordered by physician for other access device   [facility] pharmacist to adjust dose for: Vancomycin, Aminoglycosides and other anti-infective therapies as requested by physician   Method of administration may be changed at the discretion of home infusion pharmacist based upon assessment of the patient and/or caregiver's ability to self-administer the medication ordered   Complete by: As directed    No wound care   Complete by: As directed    Outpatient Parenteral Antibiotic Therapy Information Antibiotic: Cefepime (Maxipime) IVPB, Vancomycin IVPB; Indications for use: pna and bacteremia; End Date: 04/11/2023   Complete by: As directed    Antibiotic:  Cefepime (Maxipime) IVPB Vancomycin IVPB     Indications for use: pna and bacteremia   End Date: 04/11/2023       Allergies as of 03/18/2023    No Known Allergies      Medication List     STOP taking these medications    acetaminophen 500 MG tablet Commonly known as: TYLENOL Replaced by: acetaminophen 160 MG/5ML solution   albuterol 108 (90 Base) MCG/ACT inhaler Commonly known as: VENTOLIN HFA   aspirin-acetaminophen-caffeine 250-250-65 MG tablet Commonly known as: EXCEDRIN MIGRAINE   atorvastatin 10 MG tablet Commonly known as: LIPITOR   BAZA PROTECT MOISTURE BARRIER EX   Biofreeze Professional 5 % Gel Generic drug: Menthol (Topical Analgesic)   calcium carbonate 500 MG chewable tablet Commonly known as: TUMS - dosed in mg elemental calcium   Cholecalciferol 50 MCG (2000 UT) Tabs   diclofenac Sodium 1 % Gel Commonly known as: VOLTAREN   DULoxetine 60 MG capsule Commonly known as: CYMBALTA   ferrous sulfate 325 (65 FE) MG tablet   fluticasone 50 MCG/ACT nasal spray Commonly known as: FLONASE   folic acid 1 MG tablet Commonly known as: FOLVITE   gabapentin 800 MG tablet Commonly known as: NEURONTIN   guaifenesin 100 MG/5ML syrup Commonly  known as: ROBITUSSIN   HM Lidocaine Patch 4 % Generic drug: lidocaine   ipratropium 0.02 % nebulizer solution Commonly known as: ATROVENT   losartan 25 MG tablet Commonly known as: COZAAR   methotrexate 2.5 MG tablet Commonly known as: RHEUMATREX   montelukast 10 MG tablet Commonly known as: SINGULAIR   nitrofurantoin (macrocrystal-monohydrate) 100 MG capsule Commonly known as: MACROBID   oxyCODONE 5 MG immediate release tablet Commonly known as: Oxy IR/ROXICODONE   Ozempic (0.25 or 0.5 MG/DOSE) 2 MG/3ML Sopn Generic drug: Semaglutide(0.25 or 0.5MG /DOS)   polyethylene glycol 17 g packet Commonly known as: MIRALAX / GLYCOLAX   tiZANidine 4 MG tablet Commonly known as: ZANAFLEX   topiramate 25 MG tablet Commonly known as: TOPAMAX       TAKE these medications    acetaminophen 160 MG/5ML solution Commonly known as: TYLENOL Place 20.3  mLs (650 mg total) into feeding tube every 4 (four) hours as needed for mild pain (pain score 1-3) (or temp > 37.5 C (99.5 F)). Replaces: acetaminophen 500 MG tablet   budesonide 0.5 MG/2ML nebulizer solution Commonly known as: PULMICORT Take 0.5 mg by nebulization 2 times daily at 12 noon and 4 pm.   ceFEPIme 2 g in sodium chloride 0.9 % 100 mL Inject 2 g into the vein every 12 (twelve) hours.   cycloSPORINE 0.05 % ophthalmic emulsion Commonly known as: RESTASIS Place 2 drops into both eyes 2 (two) times daily.   dextrose 5% lactated ringers 5 % infusion Inject 50 mL/hr into the vein continuous.   dextrose 50 % solution Inject 0-50 mLs into the vein as needed for low blood sugar.   famotidine 20 MG tablet Commonly known as: PEPCID Place 1 tablet (20 mg total) into feeding tube 2 (two) times daily. What changed:  how to take this when to take this   free water Soln Place 300 mLs into feeding tube every 2 (two) hours.   ipratropium-albuterol 0.5-2.5 (3) MG/3ML Soln Commonly known as: DUONEB Take 3 mLs by nebulization every 4 (four) hours as needed.   levothyroxine 200 mcg in sodium chloride 0.9 % 500 mL Inject 10 mcg/hr into the vein continuous.   mupirocin ointment 2 % Commonly known as: BACTROBAN Place into the nose 2 (two) times daily.   norepinephrine 4-5 MG/250ML-% Soln Commonly known as: LEVOPHED Inject 0-40 mcg/min into the vein continuous.   senna-docusate 8.6-50 MG tablet Commonly known as: Senokot-S Place 1 tablet into feeding tube 2 (two) times daily. What changed:  how much to take how to take this when to take this   vancomycin 500 MG/100ML IVPB Commonly known as: VANCOREADY Inject 100 mLs (500 mg total) into the vein once for 1 dose.   vasopressin 20 units/100 mL Soln Inject 0-0.05 Units/min into the vein continuous.               Discharge Care Instructions  (From admission, onward)           Start     Ordered   03/18/23 0000   Change dressing on IV access line weekly and PRN  (Home infusion instructions - [facility] )        03/18/23 1207             Follow-up appointment   Transfer to [facility] Discharge Condition:    stable  Physician Statement:   The Patient was personally examined, the discharge assessment and plan has been personally reviewed and I agree with PA-C Dawt Reeb's  assessment and plan. 35 minutes of time have been dedicated to discharge assessment, planning and discharge instructions.   Signed: Lidia Collum 03/18/2023, 12:09 PM

## 2023-03-18 NOTE — Progress Notes (Signed)
NAME:  Michelle Gay, MRN:  [id number], DOB:  [birth year], LOS: 3 ADMISSION DATE:  03/14/2023, CONSULTATION DATE:  03/18/23 REFERRING MD:  Roxy Horseman PA, CHIEF COMPLAINT:  ICH   History of Present Illness:  History obtained from the ED provider and electronic medical record as the patient arrived with altered mentation and is currently intubated.  Has a known history of COPD and hypertension.  Patient was transported to the emergency department around 10:42 PM this evening from her skilled facility by EMS.  Patient was found at 915 after dinner unresponsive.  Notably does take pain medication and Narcan was administered without improvement.  Patient was documented as normal around 7:30 PM.  Upon EMS arrival patient was unresponsive.  Patient arrived to the emergency department on nonrebreather mask with sonorous respirations.  With altered mental status patient was also noted to have no gag reflex.  Patient was subsequently endotracheally intubated by ED provider.  In the emergency department the patient's blood pressure has fluctuated.  She has also been somewhat hypothermic.  CT imaging of the brain revealed a "massive intraparenchymal hematoma" centered in the left frontal lobe resulting in rightward midline shift with subfalcine and left uncal herniation as well as extensive edema and hemorrhage within the fourth ventricle.  Patient was evaluated by neurology.  Staff as well as myself have attempted to contact multiple family members.  I personally called Meriel Flavors [phone]), Wonda Amis [phone]), and Barbara Cower [phone]).  Unfortunately I was only able to leave a message with the first and last telephone numbers.  Ultimately Ms. Maisie Fus did call back and spoke with someone in the emergency department.  Family are en route to our facility but arrival time is unclear.  Family did not wish to discuss CODE STATUS or goals of care until they arrived per ED provider report. PCCM  asked to consult and assist neurology with the patient management as she is now intubated and we are awaiting family arrival to discuss goals of care. At the time of my evaluation patient had remained off of sedation since endotracheal intubation around 11:30 PM this evening.  Patient was hypotensive requiring peripherally infuse Levophed to maintain her mean arterial pressure.  Significant Hospital Events: Including procedures, antibiotic start and stop dates in addition to other pertinent events   10/26 - Presented to hospital with AMS >> Intubated by EDP 12-Apr-2023 declared brain dead   Interim History / Subjective:  Intubated on vent Plan to transfer to [facility] hospital for possible organ donation around 2 pm today  Objective   Blood pressure 130/62, pulse 89, temperature (!) 97.3 F (36.3 C), resp. rate (!) 30, height 5\' 2"  (1.575 m), weight 115.4 kg, SpO2 100%.    Vent Mode: PRVC FiO2 (%):  [40 %] 40 % Set Rate:  [30 bmp] 30 bmp Vt Set:  [400 mL] 400 mL PEEP:  [5 cmH20] 5 cmH20 Plateau Pressure:  [18 cmH20-21 cmH20] 19 cmH20   Intake/Output Summary (Last 24 hours) at 03/18/2023 0726 Last data filed at 03/18/2023 0700 Gross per 24 hour  Intake 3853.53 ml  Output 2890 ml  Net 963.53 ml   Filed Weights   03/14/23 2246 03/16/23 0400  Weight: (!) 158.8 kg 115.4 kg    Examination: General:  critically ill appearing on mech vent HEENT: MM pink/moist; ETT in place Neuro: unresponsive; pupils fixed/dilated CV: s1s2, RRR, no m/r/g PULM:  dim clear BS bilaterally; on mech vent PRVC GI: soft, bsx4 active  Extremities: warm/dry, ble edema  Skin: no rashes or lesions   Assessment & Plan:   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  CT imaging shows midline shift, intraventricular extension, and herniation.  S/p aggressive measures with HTS infusion, now brain dead. Plan: -supportive care -plan transport to [facility] later today for possible organ donation  Shock: Likely secondary to  herniation.   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  Secondary to intracerebral hemorrhage. Plan: -LTVV strategy with tidal volumes of 6-8 cc/kg ideal body weight -Wean PEEP/FiO2 for SpO2 >92% -VAP bundle in place  DMT2 Plan: -currently off insulin drip -cbg monitoring  Hypokalemia Hypophosphatemia Hypernatremia AKI Plan: -cont free water -Trend BMP / phosph / mag / urinary output -Replace electrolytes as indicated -Avoid nephrotoxic agents, ensure adequate renal perfusion  HTN Plan: -hold home anti-hypertensives  Skin lesion Plan: -punch biopsy on 10/28 per [facility]  Liver enzyme elevation Plan: -liver biopsy on 10/28 per [facility]  Best Practice (right click and "Reselect all SmartList Selections" daily)   Diet/type: NPO DVT prophylaxis: not indicated GI prophylaxis: PPI Lines: N/A Foley:  N/A Code Status:  DNR Last date of multidisciplinary goals of care discussion [10/30 attempted to call both nieces but no answer over phone]  Labs   CBC: Recent Labs  Lab 03/15/23 2026 03/15/23 2336 03/17/23 0039 03/17/23 0505 03/17/23 1200 03/17/23 1526 03/17/23 1800 03/18/23 0000 03/18/23 0518  WBC 7.8   < > 19.0*  --  17.8*  --  17.0* 15.4* 12.0*  NEUTROABS 4.5  --   --   --  14.8*  --  14.2* 12.4* 9.3*  HGB 11.1*   < > 8.9*   < > 8.0* 8.5* 8.0* 7.8* 7.7*  HCT 36.6   < > 29.8*   < > 26.7* 25.0* 26.9* 25.9* 25.0*  MCV 92.0   < > 93.4  --  91.8  --  92.8 90.9 91.6  PLT 276   < > 200  --  170  --  172 155 143*   < > = values in this interval not displayed.    Basic Metabolic Panel: Recent Labs  Lab 03/17/23 0039 03/17/23 0257 03/17/23 1200 03/17/23 1258 03/17/23 1342 03/17/23 1526 03/17/23 1800 03/18/23 0000 03/18/23 0039 03/18/23 0518  NA 157*   < > 155*  --   --  158* 152* 147*  --  150*  K 3.1*   < > 3.6  --   --  3.6 3.7 3.6   --  3.5  CL 130*  --  129*  --   --   --  124* 122*  --  123*  CO2 21*  --  20*  --   --   --  20* 20*  --  18*  GLUCOSE 276*  --  119*  --   --   --  98 74  --  98  BUN 18  --  17  --   --   --  22 19  --  21  CREATININE 1.44*  --  1.16*  --   --   --  1.14* 1.10*  --  1.22*  CALCIUM 8.9  --  8.5*  --   --   --  8.7* 8.3*  --  8.5*  MG  --    < > 2.2 2.2 2.1  --   --  2.1 2.1 2.1  PHOS  --    < >  2.7 2.5 1.9*  --   --  2.6 2.7 2.9   < > = values in this interval not displayed.   GFR: Estimated Creatinine Clearance: 50.9 mL/min (A) (by C-G formula based on SCr of 1.22 mg/dL (H)). Recent Labs  Lab 03/14/23 2309 03/15/23 0143 03/15/23 0402 03/15/23 1845 03/16/23 0105 03/16/23 0614 03/17/23 1200 03/17/23 1800 03/18/23 0000 03/18/23 0518  WBC 7.1  --   --    < > 8.8   < > 17.8* 17.0* 15.4* 12.0*  LATICACIDVEN 2.2* 3.2* 1.8  --  2.0*  --   --   --   --   --    < > = values in this interval not displayed.    Liver Function Tests: Recent Labs  Lab 03/17/23 0039 03/17/23 1200 03/17/23 1800 03/18/23 0000 03/18/23 0518  AST 19 15 13* 14* 16  ALT 13 12 12 14 15   ALKPHOS 70 72 72 83 81  BILITOT 0.3 0.3 0.5 0.3 0.5  PROT 7.0 6.5 6.5 6.7 6.5  ALBUMIN 2.3* 2.1* 2.1* 2.1* 2.0*   No results for input(s): "LIPASE", "AMYLASE" in the last 168 hours. No results for input(s): "AMMONIA" in the last 168 hours.  ABG    Component Value Date/Time   PHART 7.39 03/18/2023 0225   PCO2ART 32 03/18/2023 0225   PO2ART 156 (H) 03/18/2023 0225   HCO3 19.4 (L) 03/18/2023 0225   TCO2 21 (L) 03/17/2023 1526   ACIDBASEDEF 4.9 (H) 03/18/2023 0225   O2SAT 100 03/18/2023 0225     Coagulation Profile: Recent Labs  Lab 03/17/23 0631 03/17/23 1200 03/17/23 1344 03/18/23 0000 03/18/23 0518  INR 1.5* 1.5* 1.5* 1.5* 1.4*    Cardiac Enzymes: No results for input(s): "CKTOTAL", "CKMB", "CKMBINDEX", "TROPONINI" in the last 168 hours.  HbA1C: Hgb A1c MFr Bld  Date/Time Value Ref Range Status   03/15/2023 04:02 AM 6.2 (H) 4.8 - 5.6 % Final    Comment:    (NOTE) Pre diabetes:          5.7%-6.4%  Diabetes:              >6.4%  Glycemic control for   <7.0% adults with diabetes   12/08/2022 05:22 PM 6.6 (H) 4.8 - 5.6 % Final    Comment:    (NOTE) Pre diabetes:          5.7%-6.4%  Diabetes:              >6.4%  Glycemic control for   <7.0% adults with diabetes     CBG: Recent Labs  Lab 03/17/23 1711 03/17/23 1930 03/17/23 2216 03/18/23 0006 03/18/23 0403  GLUCAP 121* 74 70 76 88    IMAGING: PORT CXR 03/14/23 (personally reviewed by me):   Endotracheal tube approximately 2 cm from the carina.  No bowel fullness and right hilum and right lower lobe adjacent to the cardiac border.  Question possible consolidation.  Film rotated slightly to the right but trachea midline.  Normal mediastinal contour.  No pleural effusion.  No pneumothorax.  Enteric feeding tube courses below the diaphragm.  CT HEAD W/O CONTRAST 03/14/23 (per radiologist):   IMPRESSION: 1. Massive intraparenchymal hematoma centered in the left frontal lobe measuring 9.8 x 4.7 x 4.5 cm (103 mL). This causes 20 mm of rightward midline shift, subfalcine herniation and left uncal herniation. 2. Blood within the fourth ventricle.  Review of Systems:   Unable to obtain a review of systems given patient's altered mental status.  Past Medical History:   Past Medical History:  Diagnosis Date   COPD (chronic obstructive pulmonary disease) (HCC)    DM2 (diabetes mellitus, type 2) (HCC)    HTN (hypertension)    MCI (mild cognitive impairment)    Morbid obesity with BMI of 60.0-69.9, adult (HCC)    Surgical History:  Unable to obtain a surgical history given patient's altered mental status.  Social History:   Social History   Socioeconomic History   Marital status: Single    Spouse name: Not on file   Number of children: Not on file   Years of education: Not on file   Highest education level: Not  on file  Occupational History   Not on file  Tobacco Use   Smoking status: Unknown   Smokeless tobacco: Never  Vaping Use   Vaping status: Never Used  Substance and Sexual Activity   Alcohol use: Not Currently   Drug use: Not Currently   Sexual activity: Not Currently  Other Topics Concern   Not on file  Social History Narrative   Not on file   Social Determinants of Health   Financial Resource Strain: Not on file  Food Insecurity: No Food Insecurity (09/20/2022)   Hunger Vital Sign    Worried About Running Out of Food in the Last Year: Never true    Ran Out of Food in the Last Year: Never true  Transportation Needs: No Transportation Needs (09/20/2022)   PRAPARE - Administrator, Civil Service (Medical): No    Lack of Transportation (Non-Medical): No  Physical Activity: Not on file  Stress: Not on file  Social Connections: Not on file    Family History:  Unable to obtain a family medical history given patient's altered mental status.  Allergies No Known Allergies   Home Medications  Prior to Admission medications   Medication Sig Start Date End Date Taking? Authorizing Provider  acetaminophen (TYLENOL) 500 MG tablet Take 1,000 mg by mouth 2 (two) times daily as needed for mild pain (pain score 1-3) or headache.   Yes [provider]  losartan (COZAAR) 25 MG tablet Take 25 mg by mouth daily. 02/10/23  Yes [provider]  nitrofurantoin, macrocrystal-monohydrate, (MACROBID) 100 MG capsule Take 100 mg by mouth 2 (two) times daily. 03/09/23  Yes [provider]  albuterol (VENTOLIN HFA) 108 (90 Base) MCG/ACT inhaler Inhale 2 puffs into the lungs every 6 (six) hours as needed for shortness of breath. 09/01/22   [provider]  aspirin 81 MG chewable tablet Chew 81 mg by mouth daily.    [provider]  aspirin-acetaminophen-caffeine (EXCEDRIN MIGRAINE) [phone] MG tablet Take 1 tablet by mouth daily as needed for headache  or migraine.    [provider]  atorvastatin (LIPITOR) 10 MG tablet Take 10 mg by mouth at bedtime. 08/10/22   [provider]  Benzocaine-Menthol (CEPACOL SORE THROAT) 10-2.1 MG LOZG Use as directed 1 lozenge in the mouth or throat daily.    [provider]  budesonide (PULMICORT) 0.5 MG/2ML nebulizer solution Take 0.5 mg by nebulization 2 times daily at 12 noon and 4 pm. 09/09/22   [provider]  calcium carbonate (TUMS - DOSED IN MG ELEMENTAL CALCIUM) 500 MG chewable tablet Chew 2 tablets by mouth daily.    [provider]  Cholecalciferol 50 MCG (2000 UT) TABS Take 2,000 Units by mouth daily.    [provider]  cloNIDine (CATAPRES) 0.1 MG tablet Take 0.1  mg by mouth every 12 (twelve) hours as needed (hypertension). If SBP > 160 or DBP >100. If BP >160/100 1 hour after administration call MD.    [provider]  cycloSPORINE (RESTASIS) 0.05 % ophthalmic emulsion Place 2 drops into both eyes 2 (two) times daily. 07/26/22   [provider]  diclofenac Sodium (VOLTAREN) 1 % GEL Apply 4 g topically daily. To bilateral hands 07/04/22   [provider]  DULoxetine (CYMBALTA) 60 MG capsule Take 60 mg by mouth daily. 08/10/22   [provider]  famotidine (PEPCID) 20 MG tablet Take 20 mg by mouth daily. 08/10/22   [provider]  feeding supplement (ENSURE ENLIVE / ENSURE PLUS) LIQD Take 237 mLs by mouth 3 (three) times daily between meals. Patient not taking: Reported on 12/08/2022 09/20/22   Champ Mungo, DO  ferrous sulfate 325 (65 FE) MG tablet Take 325 mg by mouth daily with breakfast.    [provider]  fluticasone (FLONASE) 50 MCG/ACT nasal spray Place 1 spray into both nostrils daily.    [provider]  folic acid (FOLVITE) 1 MG tablet Take 1 mg by mouth daily.    [provider]  gabapentin (NEURONTIN) 800 MG tablet Take 800 mg by mouth 2 (two) times daily. 08/10/22   [provider]  ipratropium (ATROVENT) 0.02 % nebulizer solution Take 0.5 mg by nebulization in the morning, at noon, and at bedtime. 09/03/22   [provider]  lidocaine (HM LIDOCAINE PATCH) 4 % Place 0.5 patches onto the skin daily.    [provider]  Menthol, Topical Analgesic, (BIOFREEZE PROFESSIONAL) 5 % GEL Apply 1 Application topically in the morning and at bedtime. To left arm & shoulder    [provider]  methotrexate (RHEUMATREX) 2.5 MG tablet Take 5 mg by mouth once a week. Caution:Chemotherapy. Protect from light. Tuesdays    [provider]  montelukast (SINGULAIR) 10 MG tablet Take 10 mg by mouth daily. 08/10/22   [provider]  oxyCODONE (OXY IR/ROXICODONE) 5 MG immediate release tablet Take 1 tablet (5 mg total) by mouth 2 (two) times daily. 12/15/22   Elgergawy, Leana Roe, MD  OZEMPIC, 0.25 OR 0.5 MG/DOSE, 2 MG/3ML SOPN Inject 0.5 mg into the skin once a week. Tuesdays 08/13/22   [provider]  polyethylene glycol (MIRALAX / GLYCOLAX) 17 g packet Take 17 g by mouth daily.    [provider]  sennosides-docusate sodium (SENOKOT-S) 8.6-50 MG tablet Take 2 tablets by mouth daily.    [provider]  tiZANidine (ZANAFLEX) 4 MG tablet Take 2 mg by mouth in the morning and at bedtime. 08/10/22   [provider]  topiramate (TOPAMAX) 25 MG tablet Take 25 mg by mouth 2 (two) times daily. 08/10/22   [provider]  Zinc Oxide (BAZA PROTECT MOISTURE BARRIER EX) Apply 1 Application topically in the morning and at bedtime.    [provider]     Critical care time: 35 minutes    JD Daryel [facility] 03/18/2023, 7:26 AM  Please see [email or web address] for pager details.  From 7A-7P if no response, please call [phone]. After hours, please call ELink [phone].

## 2023-03-18 NOTE — TOC Transition Note (Signed)
Transition of Care [facility]) - CM/SW Discharge Note   Patient Details  Name: Corneshia Zelaya MRN: [id number] Date of Birth: [birth year]  Transition of Care [facility]) CM/SW Contact:  Tom-Johnson, Hershal Coria, RN Phone Number: 03/18/2023, 1:00 PM   Clinical Narrative:     Patient scheduled to discharge to [facility] today per [facility] for organ donation.   No further TOC needs noted.        Final next level of care: Acute to Acute Transfer [facility])     Patient Goals and CMS Choice      Discharge Placement                         Discharge Plan and Services Additional resources added to the After Visit Summary for                                       Social Determinants of Health (SDOH) Interventions SDOH Screenings   Food Insecurity: No Food Insecurity (09/20/2022)  Transportation Needs: No Transportation Needs (09/20/2022)  Tobacco Use: Unknown (03/14/2023)     Readmission Risk Interventions     No data to display

## 2023-03-18 NOTE — Progress Notes (Signed)
Pharmacy Antibiotic Note  Michelle Gay is a 72 y.o. female admitted on 03/14/2023 with bacteremia.  Pharmacy has been consulted for vancomycin and cefepime dosing. Per [facility] request, will treat MRSE bacteremia despite brain death.   Scr 1.22 this AM, VR 22 mcg/mL  Pt specific kinetics: Ke = 0.032, expected 24hr level = 39mcg/mL  -goal trough 15-20 mcg/mL   Plan: Low dose to ensure maintenance of therapeutic level throughout organ procurement . Vancomycin 500 mg IV x 1  Continue cefepime 2g IV q12h  Height: 5\' 2"  (157.5 cm) Weight: 115.4 kg (254 lb 6.6 oz) IBW/kg (Calculated) : 50.1  Temp (24hrs), Avg:97.9 F (36.6 C), Min:96.6 F (35.9 C), Max:99 F (37.2 C)  Recent Labs  Lab 03/14/23 2309 03/15/23 0143 03/15/23 0402 03/15/23 1845 03/16/23 0105 03/16/23 0614 03/17/23 0039 03/17/23 1200 03/17/23 1800 03/18/23 0000 03/18/23 0518 03/18/23 0653  WBC 7.1  --   --    < > 8.8   < > 19.0* 17.8* 17.0* 15.4* 12.0*  --   CREATININE 0.98  --  1.12*   < > 1.26*   < > 1.44* 1.16* 1.14* 1.10* 1.22*  --   LATICACIDVEN 2.2* 3.2* 1.8  --  2.0*  --   --   --   --   --   --  0.8  VANCORANDOM  --   --   --   --   --   --   --   --   --   --  22  --    < > = values in this interval not displayed.    Estimated Creatinine Clearance: 50.9 mL/min (A) (by C-G formula based on SCr of 1.22 mg/dL (H)).    No Known Allergies  Antimicrobials this admission: Vanc 10/27x1; 10/29> Cefepime 10/27>  Dose adjustments this admission:   Microbiology results: 10/27 BAL MRSA, Proteus mirabilis  10/27 BCX: 3/4 MRSE 10/27 MRSA pos 10/28 COVID neg  10/29 BCX:   Thank you for allowing pharmacy to be a part of this patient's care.  Calton Dach, PharmD, BCCCP Clinical Pharmacist 03/18/2023 8:05 AM

## 2023-03-19 LAB — CALCIUM, IONIZED: Calcium, Ionized, Serum: 5.1 mg/dL (ref 4.5–5.6)

## 2023-03-19 LAB — URINE CULTURE: Culture: NO GROWTH

## 2023-03-20 LAB — CULTURE, BLOOD (ROUTINE X 2): Special Requests: ADEQUATE

## 2023-03-21 LAB — GLUCOSE, CAPILLARY: Glucose-Capillary: 200 mg/dL — ABNORMAL HIGH (ref 70–99)

## 2023-03-22 LAB — CULTURE, BLOOD (ROUTINE X 2)
Culture: NO GROWTH
Culture: NO GROWTH
Special Requests: ADEQUATE

## 2023-03-24 LAB — SURGICAL PATHOLOGY
